# Patient Record
Sex: Male | Born: 1960 | Race: White | Hispanic: No | Marital: Single | Smoking: Never smoker
Health system: Southern US, Community
[De-identification: ages and names within clinical notes are randomized; demographics above are authoritative.]

## PROBLEM LIST (undated history)

## (undated) DIAGNOSIS — S0990XA Unspecified injury of head, initial encounter: Secondary | ICD-10-CM

## (undated) DIAGNOSIS — F329 Major depressive disorder, single episode, unspecified: Secondary | ICD-10-CM

## (undated) DIAGNOSIS — J309 Allergic rhinitis, unspecified: Secondary | ICD-10-CM

## (undated) DIAGNOSIS — F419 Anxiety disorder, unspecified: Secondary | ICD-10-CM

## (undated) DIAGNOSIS — G4730 Sleep apnea, unspecified: Secondary | ICD-10-CM

## (undated) DIAGNOSIS — M545 Low back pain, unspecified: Secondary | ICD-10-CM

## (undated) DIAGNOSIS — N189 Chronic kidney disease, unspecified: Secondary | ICD-10-CM

## (undated) DIAGNOSIS — F32A Depression, unspecified: Secondary | ICD-10-CM

## (undated) DIAGNOSIS — C61 Malignant neoplasm of prostate: Secondary | ICD-10-CM

## (undated) HISTORY — PX: VEIN LIGATION AND STRIPPING: SHX2653

## (undated) HISTORY — PX: HEMORRHOID SURGERY: SHX153

## (undated) HISTORY — PX: TONSILLECTOMY: SUR1361

## (undated) HISTORY — PX: JOINT REPLACEMENT: SHX530

## (undated) HISTORY — PX: PROSTATE BIOPSY: SHX241

---

## 1987-03-03 DIAGNOSIS — S0990XA Unspecified injury of head, initial encounter: Secondary | ICD-10-CM

## 1987-03-03 HISTORY — DX: Unspecified injury of head, initial encounter: S09.90XA

## 2006-05-12 ENCOUNTER — Emergency Department (HOSPITAL_COMMUNITY): Admission: EM | Admit: 2006-05-12 | Discharge: 2006-05-12 | Payer: Self-pay | Admitting: Emergency Medicine

## 2008-07-07 ENCOUNTER — Emergency Department (HOSPITAL_COMMUNITY): Admission: EM | Admit: 2008-07-07 | Discharge: 2008-07-07 | Payer: Self-pay | Admitting: Emergency Medicine

## 2011-09-16 ENCOUNTER — Other Ambulatory Visit: Payer: Self-pay | Admitting: Orthopedic Surgery

## 2011-09-16 DIAGNOSIS — M25512 Pain in left shoulder: Secondary | ICD-10-CM

## 2011-09-17 ENCOUNTER — Ambulatory Visit
Admission: RE | Admit: 2011-09-17 | Discharge: 2011-09-17 | Disposition: A | Discharge status: Home / Self Care | Payer: Medicare Other | Source: Ambulatory Visit | Attending: Orthopedic Surgery | Admitting: Orthopedic Surgery

## 2011-09-17 DIAGNOSIS — M25512 Pain in left shoulder: Secondary | ICD-10-CM

## 2011-09-24 ENCOUNTER — Other Ambulatory Visit: Payer: Self-pay | Admitting: Orthopedic Surgery

## 2011-09-28 ENCOUNTER — Encounter (HOSPITAL_COMMUNITY): Payer: Self-pay | Admitting: Pharmacy Technician

## 2011-10-01 ENCOUNTER — Encounter (HOSPITAL_COMMUNITY): Payer: Self-pay

## 2011-10-01 ENCOUNTER — Encounter (HOSPITAL_COMMUNITY)
Admission: RE | Admit: 2011-10-01 | Discharge: 2011-10-01 | Disposition: A | Discharge status: Home / Self Care | Payer: Medicare Other | Source: Ambulatory Visit | Attending: Orthopedic Surgery | Admitting: Orthopedic Surgery

## 2011-10-01 HISTORY — DX: Unspecified injury of head, initial encounter: S09.90XA

## 2011-10-01 HISTORY — DX: Sleep apnea, unspecified: G47.30

## 2011-10-01 HISTORY — DX: Chronic kidney disease, unspecified: N18.9

## 2011-10-01 LAB — CBC
HCT: 43 % (ref 39.0–52.0)
Hemoglobin: 14.9 g/dL (ref 13.0–17.0)
MCH: 30.7 pg (ref 26.0–34.0)
MCHC: 34.7 g/dL (ref 30.0–36.0)
MCV: 88.5 fL (ref 78.0–100.0)
RBC: 4.86 MIL/uL (ref 4.22–5.81)

## 2011-10-01 LAB — SURGICAL PCR SCREEN: MRSA, PCR: NEGATIVE

## 2011-10-01 NOTE — Patient Instructions (Signed)
20 Steve Mcmahon.  10/01/2011   Your procedure is scheduled on:  Wednesday 10/07/2011 at 1200 pm  Report to [facility] at 0930 AM.  Call this number if you have problems the morning of surgery: [phone]   Remember:   Do not eat food:After Midnight.  May have clear liquids:until Midnight .    Take these medicines the morning of surgery with A SIP OF WATER: Klonopin   Do not wear jewelry  Do not wear lotions, powders, or perfumes.   Do not shave 48 hours prior to surgery. Men may shave face and neck.  Do not bring valuables to the hospital.  Contacts, dentures or bridgework may not be worn into surgery.  Leave suitcase in the car. After surgery it may be brought to your room.  For patients admitted to the hospital, checkout time is 11:00 AM the day of discharge.       Special Instructions: CHG Shower Use Special Wash: 1/2 bottle night before surgery and 1/2 bottle morning of surgery.   Please read over the following fact sheets that you were given: MRSA Information, Sleep apnea sheet, Incentive Spirometry sheet                 If any questions ,please call me at [phone] [facility].Georgeanna Lea, RN,BSN

## 2011-10-07 ENCOUNTER — Encounter (HOSPITAL_COMMUNITY): Payer: Self-pay | Admitting: *Deleted

## 2011-10-07 ENCOUNTER — Encounter (HOSPITAL_COMMUNITY)
Admission: RE | Disposition: A | Discharge status: Home / Self Care | Payer: Self-pay | Source: Ambulatory Visit | Attending: Orthopedic Surgery

## 2011-10-07 ENCOUNTER — Ambulatory Visit (HOSPITAL_COMMUNITY): Payer: Medicare Other | Admitting: Anesthesiology

## 2011-10-07 ENCOUNTER — Encounter (HOSPITAL_COMMUNITY): Payer: Self-pay | Admitting: Anesthesiology

## 2011-10-07 ENCOUNTER — Observation Stay (HOSPITAL_COMMUNITY)
Admission: RE | Admit: 2011-10-07 | Discharge: 2011-10-08 | Disposition: A | Discharge status: Home / Self Care | Payer: Medicare Other | Source: Ambulatory Visit | Attending: Orthopedic Surgery | Admitting: Orthopedic Surgery

## 2011-10-07 DIAGNOSIS — Z01812 Encounter for preprocedural laboratory examination: Secondary | ICD-10-CM | POA: Insufficient documentation

## 2011-10-07 DIAGNOSIS — G4730 Sleep apnea, unspecified: Secondary | ICD-10-CM | POA: Insufficient documentation

## 2011-10-07 DIAGNOSIS — T8489XA Other specified complication of internal orthopedic prosthetic devices, implants and grafts, initial encounter: Principal | ICD-10-CM | POA: Insufficient documentation

## 2011-10-07 DIAGNOSIS — T85848A Pain due to other internal prosthetic devices, implants and grafts, initial encounter: Secondary | ICD-10-CM

## 2011-10-07 DIAGNOSIS — Y831 Surgical operation with implant of artificial internal device as the cause of abnormal reaction of the patient, or of later complication, without mention of misadventure at the time of the procedure: Secondary | ICD-10-CM | POA: Insufficient documentation

## 2011-10-07 DIAGNOSIS — Z79899 Other long term (current) drug therapy: Secondary | ICD-10-CM | POA: Insufficient documentation

## 2011-10-07 DIAGNOSIS — M25519 Pain in unspecified shoulder: Secondary | ICD-10-CM | POA: Insufficient documentation

## 2011-10-07 SURGERY — ARTHROSCOPY, SHOULDER, WITH ROTATOR CUFF REPAIR
Anesthesia: General | Site: Shoulder | Laterality: Left | Wound class: Clean

## 2011-10-07 MED ORDER — FENTANYL CITRATE 0.05 MG/ML IJ SOLN
50.0000 ug | INTRAMUSCULAR | Status: DC | PRN
  Administered 2011-10-07: 100 ug via INTRAVENOUS

## 2011-10-07 MED ORDER — METHOCARBAMOL 100 MG/ML IJ SOLN
500.0000 mg | Freq: Four times a day (QID) | INTRAVENOUS | Status: DC | PRN
  Filled 2011-10-07: qty 5

## 2011-10-07 MED ORDER — LACTATED RINGERS IR SOLN
Status: DC | PRN
  Administered 2011-10-07: 3000 mL

## 2011-10-07 MED ORDER — DEXTROSE IN LACTATED RINGERS 5 % IV SOLN: INTRAVENOUS | Status: DC

## 2011-10-07 MED ORDER — VANCOMYCIN HCL 1000 MG IV SOLR
1000.0000 mg | INTRAVENOUS | Status: DC | PRN
  Administered 2011-10-07: 1000 mg via INTRAVENOUS

## 2011-10-07 MED ORDER — GLYCOPYRROLATE 0.2 MG/ML IJ SOLN
INTRAMUSCULAR | Status: DC | PRN
  Administered 2011-10-07: 0.2 mg via INTRAVENOUS
  Administered 2011-10-07: .2 mg via INTRAVENOUS

## 2011-10-07 MED ORDER — MIDAZOLAM HCL 2 MG/2ML IJ SOLN
INTRAMUSCULAR | Status: AC
  Filled 2011-10-07: qty 2

## 2011-10-07 MED ORDER — ONDANSETRON HCL 4 MG/2ML IJ SOLN: 4.0000 mg | Freq: Four times a day (QID) | INTRAMUSCULAR | Status: DC | PRN

## 2011-10-07 MED ORDER — LIDOCAINE HCL (CARDIAC) 20 MG/ML IV SOLN
INTRAVENOUS | Status: DC | PRN
  Administered 2011-10-07: 100 mg via INTRAVENOUS

## 2011-10-07 MED ORDER — POVIDONE-IODINE 7.5 % EX SOLN: Freq: Once | CUTANEOUS | Status: DC

## 2011-10-07 MED ORDER — PROMETHAZINE HCL 25 MG/ML IJ SOLN: 6.2500 mg | INTRAMUSCULAR | Status: DC | PRN

## 2011-10-07 MED ORDER — ACETAMINOPHEN 10 MG/ML IV SOLN
INTRAVENOUS | Status: DC | PRN
  Administered 2011-10-07: 1000 mg via INTRAVENOUS

## 2011-10-07 MED ORDER — DEXTROSE IN LACTATED RINGERS 5 % IV SOLN
INTRAVENOUS | Status: DC
  Administered 2011-10-07: 19:00:00 via INTRAVENOUS

## 2011-10-07 MED ORDER — HYDROMORPHONE HCL PF 1 MG/ML IJ SOLN: 0.2500 mg | INTRAMUSCULAR | Status: DC | PRN

## 2011-10-07 MED ORDER — METHOCARBAMOL 500 MG PO TABS: 500.0000 mg | ORAL_TABLET | Freq: Four times a day (QID) | ORAL | Status: AC

## 2011-10-07 MED ORDER — PHENYLEPHRINE HCL 10 MG/ML IJ SOLN
INTRAMUSCULAR | Status: DC | PRN
  Administered 2011-10-07 (×2): 100 ug via INTRAVENOUS
  Administered 2011-10-07 (×2): 40 ug via INTRAVENOUS

## 2011-10-07 MED ORDER — EPINEPHRINE HCL 1 MG/ML IJ SOLN
INTRAMUSCULAR | Status: AC
  Filled 2011-10-07: qty 2

## 2011-10-07 MED ORDER — FENTANYL CITRATE 0.05 MG/ML IJ SOLN
INTRAMUSCULAR | Status: AC
  Filled 2011-10-07: qty 2

## 2011-10-07 MED ORDER — PROPOFOL 10 MG/ML IV EMUL
INTRAVENOUS | Status: DC | PRN
  Administered 2011-10-07: 150 mg via INTRAVENOUS

## 2011-10-07 MED ORDER — ROPIVACAINE HCL 5 MG/ML IJ SOLN
INTRAMUSCULAR | Status: AC
  Filled 2011-10-07: qty 30

## 2011-10-07 MED ORDER — ROCURONIUM BROMIDE 100 MG/10ML IV SOLN
INTRAVENOUS | Status: DC | PRN
  Administered 2011-10-07: 40 mg via INTRAVENOUS

## 2011-10-07 MED ORDER — BUPIVACAINE-EPINEPHRINE (PF) 0.5% -1:200000 IJ SOLN
INTRAMUSCULAR | Status: AC
  Filled 2011-10-07: qty 10

## 2011-10-07 MED ORDER — LACTATED RINGERS IV SOLN
INTRAVENOUS | Status: DC
  Administered 2011-10-07: 1000 mL via INTRAVENOUS
  Administered 2011-10-07: 12:00:00 via INTRAVENOUS

## 2011-10-07 MED ORDER — HYDROMORPHONE HCL PF 1 MG/ML IJ SOLN
0.5000 mg | INTRAMUSCULAR | Status: DC | PRN
  Administered 2011-10-08 (×3): 1 mg via INTRAVENOUS
  Filled 2011-10-07 (×3): qty 1

## 2011-10-07 MED ORDER — ROPIVACAINE HCL 5 MG/ML IJ SOLN
INTRAMUSCULAR | Status: DC | PRN
  Administered 2011-10-07: 30 mL

## 2011-10-07 MED ORDER — MEPERIDINE HCL 50 MG/ML IJ SOLN: 6.2500 mg | INTRAMUSCULAR | Status: DC | PRN

## 2011-10-07 MED ORDER — METOCLOPRAMIDE HCL 5 MG/ML IJ SOLN: 5.0000 mg | Freq: Three times a day (TID) | INTRAMUSCULAR | Status: DC | PRN

## 2011-10-07 MED ORDER — OXYCODONE-ACETAMINOPHEN 5-325 MG PO TABS
1.0000 | ORAL_TABLET | ORAL | Status: DC | PRN
  Administered 2011-10-07 – 2011-10-08 (×2): 1 via ORAL
  Administered 2011-10-08: 2 via ORAL
  Filled 2011-10-07 (×2): qty 1
  Filled 2011-10-07: qty 2

## 2011-10-07 MED ORDER — ONDANSETRON HCL 4 MG/2ML IJ SOLN
INTRAMUSCULAR | Status: DC | PRN
  Administered 2011-10-07: 4 mg via INTRAVENOUS

## 2011-10-07 MED ORDER — EPINEPHRINE HCL 1 MG/ML IJ SOLN
INTRAMUSCULAR | Status: DC | PRN
  Administered 2011-10-07: 1 mg

## 2011-10-07 MED ORDER — METHOCARBAMOL 500 MG PO TABS
500.0000 mg | ORAL_TABLET | Freq: Four times a day (QID) | ORAL | Status: DC | PRN
  Administered 2011-10-08: 500 mg via ORAL
  Filled 2011-10-07: qty 1

## 2011-10-07 MED ORDER — SUFENTANIL CITRATE 50 MCG/ML IV SOLN
INTRAVENOUS | Status: DC | PRN
  Administered 2011-10-07 (×2): 5 ug via INTRAVENOUS

## 2011-10-07 MED ORDER — LACTATED RINGERS IV SOLN: INTRAVENOUS | Status: DC

## 2011-10-07 MED ORDER — OXYCODONE-ACETAMINOPHEN 5-325 MG PO TABS: 1.0000 | ORAL_TABLET | ORAL | Status: AC | PRN

## 2011-10-07 MED ORDER — VANCOMYCIN HCL IN DEXTROSE 1-5 GM/200ML-% IV SOLN
INTRAVENOUS | Status: AC
  Filled 2011-10-07: qty 200

## 2011-10-07 MED ORDER — 0.9 % SODIUM CHLORIDE (POUR BTL) OPTIME
TOPICAL | Status: DC | PRN
  Administered 2011-10-07: 1000 mL

## 2011-10-07 MED ORDER — MIDAZOLAM HCL 2 MG/2ML IJ SOLN
1.0000 mg | INTRAMUSCULAR | Status: DC | PRN
  Administered 2011-10-07: 2 mg via INTRAVENOUS

## 2011-10-07 MED ORDER — ACETAMINOPHEN 10 MG/ML IV SOLN
INTRAVENOUS | Status: AC
  Filled 2011-10-07: qty 100

## 2011-10-07 MED ORDER — BUPIVACAINE-EPINEPHRINE 0.5% -1:200000 IJ SOLN
INTRAMUSCULAR | Status: DC | PRN
  Administered 2011-10-07: 9 mL

## 2011-10-07 SURGICAL SUPPLY — 49 items
BENZOIN TINCTURE PRP APPL 2/3 (GAUZE/BANDAGES/DRESSINGS) ×2 IMPLANT
BLADE 4.2CUDA (BLADE) ×2 IMPLANT
BLADE SURG SZ11 CARB STEEL (BLADE) ×2 IMPLANT
BOOTIES KNEE HIGH SLOAN (MISCELLANEOUS) ×4 IMPLANT
BUR OVAL 4.0 (BURR) ×2 IMPLANT
CLOTH BEACON ORANGE TIMEOUT ST (SAFETY) ×2 IMPLANT
DRAPE LG THREE QUARTER DISP (DRAPES) ×2 IMPLANT
DRAPE POUCH INSTRU U-SHP 10X18 (DRAPES) ×2 IMPLANT
DRAPE SHOULDER BEACH CHAIR (DRAPES) ×2 IMPLANT
DRAPE U-SHAPE 47X51 STRL (DRAPES) ×2 IMPLANT
DRSG ADAPTIC 3X8 NADH LF (GAUZE/BANDAGES/DRESSINGS) ×2 IMPLANT
DRSG PAD ABDOMINAL 8X10 ST (GAUZE/BANDAGES/DRESSINGS) ×2 IMPLANT
DURAPREP 26ML APPLICATOR (WOUND CARE) ×2 IMPLANT
ELECT KIT MENISCUS BASIC 165 (SET/KITS/TRAYS/PACK) IMPLANT
ELECT REM PT RETURN 9FT ADLT (ELECTROSURGICAL) ×2
ELECTRODE REM PT RTRN 9FT ADLT (ELECTROSURGICAL) ×1 IMPLANT
GLOVE BIOGEL PI IND STRL 8 (GLOVE) ×1 IMPLANT
GLOVE BIOGEL PI INDICATOR 8 (GLOVE) ×1
GLOVE ECLIPSE 8.0 STRL XLNG CF (GLOVE) ×2 IMPLANT
GLOVE INDICATOR 8.0 STRL GRN (GLOVE) ×4 IMPLANT
GOWN STRL REIN XL XLG (GOWN DISPOSABLE) ×4 IMPLANT
MANIFOLD NEPTUNE II (INSTRUMENTS) ×2 IMPLANT
NDL SAFETY ECLIPSE 18X1.5 (NEEDLE) ×1 IMPLANT
NEEDLE HYPO 18GX1.5 SHARP (NEEDLE) ×1
NEEDLE MA TROC 1/2 (NEEDLE) IMPLANT
NEEDLE SPNL 18GX3.5 QUINCKE PK (NEEDLE) ×2 IMPLANT
PACK SHOULDER CUSTOM OPM052 (CUSTOM PROCEDURE TRAY) ×2 IMPLANT
POSITIONER SURGICAL ARM (MISCELLANEOUS) ×2 IMPLANT
SET ARTHROSCOPY TUBING (MISCELLANEOUS) ×1
SET ARTHROSCOPY TUBING LN (MISCELLANEOUS) ×1 IMPLANT
SLING ARM IMMOBILIZER LRG (SOFTGOODS) ×2 IMPLANT
SLING ARM IMMOBILIZER MED (SOFTGOODS) IMPLANT
SPONGE GAUZE 4X4 12PLY (GAUZE/BANDAGES/DRESSINGS) ×2 IMPLANT
SPONGE LAP 18X18 X RAY DECT (DISPOSABLE) ×2 IMPLANT
SPONGE LAP 4X18 X RAY DECT (DISPOSABLE) ×2 IMPLANT
STAPLER VISISTAT (STAPLE) IMPLANT
STAPLER VISISTAT 35W (STAPLE) ×2 IMPLANT
STRIP CLOSURE SKIN 1/2X4 (GAUZE/BANDAGES/DRESSINGS) IMPLANT
SUCTION FRAZIER TIP 10 FR DISP (SUCTIONS) ×2 IMPLANT
SUT BONE WAX W31G (SUTURE) IMPLANT
SUT ETHIBOND NAB CT1 #1 30IN (SUTURE) ×2 IMPLANT
SUT ETHILON 4 0 PS 2 18 (SUTURE) IMPLANT
SUT VIC AB 1 CT1 27 (SUTURE) ×1
SUT VIC AB 1 CT1 27XBRD ANTBC (SUTURE) ×1 IMPLANT
SUT VIC AB 2-0 CT1 27 (SUTURE) ×1
SUT VIC AB 2-0 CT1 27XBRD (SUTURE) ×1 IMPLANT
SYR 3ML LL SCALE MARK (SYRINGE) ×2 IMPLANT
TUBING CONNECTING 10 (TUBING) ×2 IMPLANT
WAND 90 DEG TURBOVAC W/CORD (SURGICAL WAND) ×2 IMPLANT

## 2011-10-07 NOTE — Anesthesia Procedure Notes (Signed)
Anesthesia Regional Block:  Supraclavicular block  Pre-Anesthetic Checklist: ,, timeout performed, Correct Patient, Correct Site, Correct Laterality, Correct Procedure, Correct Position, site marked, Risks and benefits discussed,  Surgical consent,  Pre-op evaluation,  At surgeon's request and post-op pain management  Laterality: Left  Prep: chloraprep       Needles:  Injection technique: Single-shot  Needle Type: Stimiplex     Needle Length: 10cm 10 cm Needle Gauge: 21 G    Additional Needles:  Procedures: ultrasound guided and nerve stimulator Supraclavicular block Narrative:  Injection made incrementally with aspirations every 5 mL.  Performed by: Personally  Anesthesiologist: Phillips Grout MD  Additional Notes: Risks, benefits and alternative to block explained extensively.  Patient tolerated procedure well, without complications.  Supraclavicular block

## 2011-10-07 NOTE — Transfer of Care (Signed)
Immediate Anesthesia Transfer of Care Note  Patient: Steve Mcmahon.  Procedure(s) Performed: Procedure(s) (LRB): SHOULDER ARTHROSCOPY WITH ROTATOR CUFF REPAIR (Left) ARTHROTOMY (Left)  Patient Location: PACU  Anesthesia Type: General  Level of Consciousness: awake, alert , oriented and patient cooperative  Airway & Oxygen Therapy: Patient Spontanous Breathing and Patient connected to face mask oxygen  Post-op Assessment: Report given to PACU RN and Post -op Vital signs reviewed and stable  Post vital signs: Reviewed and stable  Complications: No apparent anesthesia complications
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
## 2011-10-07 NOTE — Brief Op Note (Signed)
10/07/2011  5:46 PM  PATIENT:  Steve Mcmahon.  51 y.o. male  PRE-OPERATIVE DIAGNOSIS:  loose screw left shoulder joint  POST-OPERATIVE DIAGNOSIS:  Fatigue fracture screw from Bristow shoulder reconstruction with painful fibrous union of bone block and painful loose screw and washer  PROCEDURE:  Procedure(s) (LRB): SHOULDER ARTHROSCOPY  With exploration and debridement of joint ARTHROTOMY (Left)with removal loosebone fragment,screw,and washer  SURGEON:  Surgeon(s) and Role:    * Drucilla Schmidt, MD - Primary  PHYSICIAN ASSISTANT: Alphonsa Overall PAC ASSISTANTS: nurse   ANESTHESIA:   regional  EBL:  Total I/O In: 2000 [I.V.:1800; IV Piggyback:200] Out: 400 [Urine:300; Blood:100]  BLOOD ADMINISTERED:none  DRAINS: none   LOCAL MEDICATIONS USED:  MARCAINE     SPECIMEN:  Source of Specimen:  srew and washer from left shoulder  DISPOSITION OF SPECIMEN:  N/A  COUNTS:  YES  TOURNIQUET:  * No tourniquets in log *  DICTATION: .Other Dictation: Dictation Number [id number]  PLAN OF CARE: Admit for overnight observation  PATIENT DISPOSITION:  PACU - hemodynamically stable.   Delay start of Pharmacological VTE agent (>24hrs) due to surgical blood loss or risk of bleeding: yes
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
## 2011-10-08 NOTE — Discharge Summary (Signed)
Physician Discharge Summary  Patient ID: Steve Mcmahon HM Lorrin Goodell. MRN: [id number] DOB/AGE: 1960-06-23 51 y.o.  Admit date: 10/07/2011 Discharge date: 10/08/2011  Admission Diagnoses:  Left shoulder painful hardware  Discharge Diagnoses:  Same   Surgeries: Procedure(s): SHOULDER ARTHROSCOPY WITH hardware removal ARTHROTOMY on 10/07/2011   Consultants: none  Discharged Condition: Stable  Hospital Course: Steve Mcmahon HM Shaheed Schmuck. is an 51 y.o. male who was admitted 10/07/2011 with a chief complaint of No chief complaint on file. , and found to have a diagnosis of <principal problem not specified>.  They were brought to the operating room on 10/07/2011 and underwent the above named procedures.    The patient had an uncomplicated hospital course and was stable for discharge.  Recent vital signs:  Filed Vitals:   10/08/11 0428  BP: 122/69  Pulse: 86  Temp: 98 F (36.7 C)  Resp: 14    Recent laboratory studies:  Results for orders placed during the hospital encounter of 10/01/11  SURGICAL PCR SCREEN      Component Value Range   MRSA, PCR NEGATIVE  NEGATIVE   Staphylococcus aureus NEGATIVE  NEGATIVE  CBC      Component Value Range   WBC 5.7  4.0 - 10.5 K/uL   RBC 4.86  4.22 - 5.81 MIL/uL   Hemoglobin 14.9  13.0 - 17.0 g/dL   HCT 40.9  81.1 - 91.4 %   MCV 88.5  78.0 - 100.0 fL   MCH 30.7  26.0 - 34.0 pg   MCHC 34.7  30.0 - 36.0 g/dL   RDW 78.2  95.6 - 21.3 %   Platelets 269  150 - 400 K/uL    Discharge Medications:   Medication List  As of 10/08/2011 11:05 AM   TAKE these medications         CALCIUM 600 + D PO   Take 1 tablet by mouth 2 (two) times daily.      clonazePAM 0.5 MG tablet   Commonly known as: KLONOPIN   Take 0.5 mg by mouth See admin instructions. Patient takes 1/2  Tablet  In am and  a tablet at bedtime      glucosamine-chondroitin 500-400 MG tablet   Take 1 tablet by mouth 2 (two) times daily.      L-Glutamine 500 MG Tabs   Take 1 tablet by mouth daily.        methocarbamol 500 MG tablet   Commonly known as: ROBAXIN   Take 1 tablet (500 mg total) by mouth 4 (four) times daily.      multivitamin with minerals Tabs   Take 1 tablet by mouth daily.      OVER THE COUNTER MEDICATION   Take 1 capsule by mouth as needed. For gas/bloating      oxyCODONE-acetaminophen 5-325 MG per tablet   Commonly known as: PERCOCET/ROXICET   Take 1 tablet by mouth every 4 (four) hours as needed for pain.      vitamin B-12 500 MCG tablet   Commonly known as: CYANOCOBALAMIN   Take 500 mcg by mouth daily.      vitamin C 1000 MG tablet   Take 1,000 mg by mouth daily.            Diagnostic Studies: Ct Shoulder Left Wo Contrast  09/17/2011  *RADIOLOGY REPORT*  Clinical Data: Increasing left shoulder pain for several months. History of rotator cuff surgery 25 years ago.  Evaluate fractured hardware.  CT OF THE LEFT SHOULDER WITHOUT CONTRAST  Technique:  Multidetector CT imaging was performed according to the standard protocol. Multiplanar CT image reconstructions were also generated.  Comparison: None.  Findings: There appears to be a single fractured screw within the glenoid.  The head of the screw and a washer are anteriorly positioned within a 1.5 cm ossicle which appears separate from the glenoid. The deep portion of the screw is imbedded within the superior aspect of the glenoid. There is no osseous lucency surrounding either component.  There are advanced glenohumeral degenerative changes with chondral thinning, subchondral cyst formation and osteophytes.  No discrete loose bodies are seen. There is no evidence of acute fracture or humeral head avascular necrosis.  The acromion is type 1.  There are no significant acromioclavicular degenerative changes.  The subacromial space is preserved.  No focal rotator cuff muscular atrophy is demonstrated.  IMPRESSION:  1.  Fractured glenoid screw.  The head of the screw is positioned within an ossicle anterior to the glenoid,  possibly from a bone block procedure for anterior stabilization.  Correlation with prior surgical history recommended. 2.  Advanced glenohumeral degenerative changes.  No discrete loose body identified. 3.  No acute osseous findings.  Original Report Authenticated By: Gerrianne Scale, M.D.    Disposition: 01-Home or Self Care  Discharge Orders    Future Orders Please Complete By Expires   Diet - low sodium heart healthy      Diet - low sodium heart healthy      Call MD / Call 911      Comments:   If you experience chest pain or shortness of breath, CALL 911 and be transported to the hospital emergency room.  If you develope a fever above 101 F, pus (white drainage) or increased drainage or redness at the wound, or calf pain, call your surgeon's office.   Constipation Prevention      Comments:   Drink plenty of fluids.  Prune juice may be helpful.  You may use a stool softener, such as Colace (over the counter) 100 mg twice a day.  Use MiraLax (over the counter) for constipation as needed.   Increase activity slowly as tolerated      Call MD / Call 911      Comments:   If you experience chest pain or shortness of breath, CALL 911 and be transported to the hospital emergency room.  If you develope a fever above 101 F, pus (white drainage) or increased drainage or redness at the wound, or calf pain, call your surgeon's office.   Constipation Prevention      Comments:   Drink plenty of fluids.  Prune juice may be helpful.  You may use a stool softener, such as Colace (over the counter) 100 mg twice a day.  Use MiraLax (over the counter) for constipation as needed.   Increase activity slowly as tolerated      Discharge instructions      Comments:   Keep dressing dry and arm in sling.  May move elboe.  Office 8/12.  Call [phone] for appt.         Signed: Hikeem Andersson B 10/08/2011, 11:05 AM

## 2011-10-08 NOTE — Progress Notes (Signed)
Utilization review completed.  

## 2011-10-09 NOTE — Anesthesia Postprocedure Evaluation (Signed)
  Anesthesia Post-op Note  Patient: Steve Mcmahon.  Procedure(s) Performed: Procedure(s) (LRB): SHOULDER ARTHROSCOPY WITH ROTATOR CUFF REPAIR (Left) ARTHROTOMY (Left)  Patient Location: PACU  Anesthesia Type: GA combined with regional for post-op pain  Level of Consciousness: awake and alert   Airway and Oxygen Therapy: Patient Spontanous Breathing  Post-op Pain: mild  Post-op Assessment: Post-op Vital signs reviewed, Patient's Cardiovascular Status Stable, Respiratory Function Stable, Patent Airway and No signs of Nausea or vomiting  Post-op Vital Signs: stable  Complications: No apparent anesthesia complications

## 2011-10-16 NOTE — Op Note (Signed)
NAME:  ZEBEDIAH, BEEZLEY NO.:  [id number]  MEDICAL RECORD NO.:  [id number]  LOCATION:  1602                         FACILITY:  [facility]  PHYSICIAN:  Marlowe Kays, M.D.  DATE OF BIRTH:  [birth year]  DATE OF PROCEDURE:  10/07/2011 DATE OF DISCHARGE:  10/08/2011                              OPERATIVE REPORT   ASSISTANT:  Maisie Fus B. Durwin Nora, PA-C          ______________________________ Marlowe Kays, M.D.     JA/MEDQ  D:  10/15/2011  T:  10/16/2011  Job:  [id number]

## 2014-07-12 ENCOUNTER — Other Ambulatory Visit: Payer: Self-pay | Admitting: Orthopedic Surgery

## 2014-07-12 DIAGNOSIS — M25512 Pain in left shoulder: Secondary | ICD-10-CM

## 2014-07-16 ENCOUNTER — Ambulatory Visit
Admission: RE | Admit: 2014-07-16 | Discharge: 2014-07-16 | Disposition: A | Discharge status: Home / Self Care | Payer: Medicare Other | Source: Ambulatory Visit | Attending: Orthopedic Surgery | Admitting: Orthopedic Surgery

## 2014-07-16 DIAGNOSIS — M25512 Pain in left shoulder: Secondary | ICD-10-CM

## 2014-07-19 ENCOUNTER — Other Ambulatory Visit: Payer: Self-pay | Admitting: Orthopedic Surgery

## 2014-07-19 DIAGNOSIS — M25511 Pain in right shoulder: Secondary | ICD-10-CM

## 2014-08-14 ENCOUNTER — Other Ambulatory Visit: Payer: Medicare Other

## 2016-11-13 ENCOUNTER — Encounter: Payer: Self-pay | Admitting: Radiation Oncology
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
## 2016-11-16 ENCOUNTER — Ambulatory Visit
Admission: RE | Admit: 2016-11-16 | Discharge: 2016-11-16 | Disposition: A | Discharge status: Home / Self Care | Payer: Non-veteran care | Source: Ambulatory Visit | Attending: Radiation Oncology | Admitting: Radiation Oncology

## 2016-11-16 ENCOUNTER — Encounter: Payer: Self-pay | Admitting: Radiation Oncology

## 2016-11-16 DIAGNOSIS — C61 Malignant neoplasm of prostate: Secondary | ICD-10-CM | POA: Diagnosis not present

## 2016-11-16 DIAGNOSIS — Z510 Encounter for antineoplastic radiation therapy: Secondary | ICD-10-CM | POA: Insufficient documentation

## 2016-11-16 DIAGNOSIS — Z87820 Personal history of traumatic brain injury: Secondary | ICD-10-CM | POA: Diagnosis not present

## 2016-11-16 DIAGNOSIS — F329 Major depressive disorder, single episode, unspecified: Secondary | ICD-10-CM | POA: Diagnosis not present

## 2016-11-16 DIAGNOSIS — Z87442 Personal history of urinary calculi: Secondary | ICD-10-CM | POA: Insufficient documentation

## 2016-11-16 DIAGNOSIS — R413 Other amnesia: Secondary | ICD-10-CM | POA: Diagnosis not present

## 2016-11-16 DIAGNOSIS — Z79899 Other long term (current) drug therapy: Secondary | ICD-10-CM | POA: Insufficient documentation

## 2016-11-16 DIAGNOSIS — Z880 Allergy status to penicillin: Secondary | ICD-10-CM | POA: Diagnosis not present

## 2016-11-16 DIAGNOSIS — F419 Anxiety disorder, unspecified: Secondary | ICD-10-CM | POA: Diagnosis not present

## 2016-11-16 DIAGNOSIS — Z9889 Other specified postprocedural states: Secondary | ICD-10-CM | POA: Insufficient documentation

## 2016-11-16 DIAGNOSIS — R51 Headache: Secondary | ICD-10-CM | POA: Diagnosis not present

## 2016-11-16 DIAGNOSIS — G4730 Sleep apnea, unspecified: Secondary | ICD-10-CM | POA: Insufficient documentation

## 2016-11-16 DIAGNOSIS — N189 Chronic kidney disease, unspecified: Secondary | ICD-10-CM | POA: Diagnosis not present

## 2016-11-16 HISTORY — DX: Low back pain, unspecified: M54.50

## 2016-11-16 HISTORY — DX: Major depressive disorder, single episode, unspecified: F32.9

## 2016-11-16 HISTORY — DX: Allergic rhinitis, unspecified: J30.9

## 2016-11-16 HISTORY — DX: Anxiety disorder, unspecified: F41.9

## 2016-11-16 HISTORY — DX: Malignant neoplasm of prostate: C61

## 2016-11-16 HISTORY — DX: Depression, unspecified: F32.A

## 2016-11-16 HISTORY — DX: Low back pain: M54.5
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
## 2016-11-16 NOTE — Progress Notes (Signed)
See progress note under physician encounter. 

## 2016-11-18 ENCOUNTER — Ambulatory Visit
Admission: RE | Admit: 2016-11-18 | Discharge: 2016-11-18 | Disposition: A | Discharge status: Home / Self Care | Payer: Non-veteran care | Source: Ambulatory Visit | Attending: Radiation Oncology | Admitting: Radiation Oncology

## 2016-11-18 DIAGNOSIS — C61 Malignant neoplasm of prostate: Secondary | ICD-10-CM

## 2016-11-18 DIAGNOSIS — Z510 Encounter for antineoplastic radiation therapy: Secondary | ICD-10-CM | POA: Diagnosis not present

## 2016-11-18 NOTE — Progress Notes (Signed)
  Radiation Oncology         [phone]) [phone] ________________________________  Name: Steve Mcmahon. MRN: [id number]  Date: 11/18/2016  DOB: 11-08-1960  SIMULATION AND TREATMENT PLANNING NOTE    ICD-10-CM   1. Neoplasm of prostate, malignant ([place]) C61     DIAGNOSIS:  56 y.o. gentleman with Stage T1c adenocarcinoma of the prostate with Gleason Score of 3+4, and PSA of 5.63  NARRATIVE:  The patient was brought to the [facility].  Identity was confirmed.  All relevant records and images related to the planned course of therapy were reviewed.  The patient freely provided informed written consent to proceed with treatment after reviewing the details related to the planned course of therapy. The consent form was witnessed and verified by the simulation staff.  Then, the patient was set-up in a stable reproducible supine position for radiation therapy.  A vacuum lock pillow device was custom fabricated to position his legs in a reproducible immobilized position.  Then, I performed a urethrogram under sterile conditions to identify the prostatic apex.  CT images were obtained.  Surface markings were placed.  The CT images were loaded into the planning software.  Then the prostate target and avoidance structures including the rectum, bladder, bowel and hips were contoured.  Treatment planning then occurred.  The radiation prescription was entered and confirmed.  A total of one complex treatment devices was fabricated. I have requested : Intensity Modulated Radiotherapy (IMRT) is medically necessary for this case for the following reason:  Rectal sparing.Marland Kitchen  PLAN:  The patient will receive 78 Gy in 40 fractions.  ________________________________  Sheral Apley Tammi Klippel, M.D.   This document serves as a record of services personally performed by Tyler Pita, MD. It was created on his behalf by Arlyce Harman, a trained medical scribe. The creation of this record is based on the  scribe's personal observations and the provider's statements to them. This document has been checked and approved by the attending provider.

## 2016-11-20 ENCOUNTER — Telehealth: Payer: Self-pay | Admitting: Medical Oncology

## 2016-11-20 NOTE — Telephone Encounter (Signed)
Left a message to introduce myself as the prostate nurse navigator. I was unable to meet him the day he consulted with Dr. Tammi Klippel. He will begin radiation treatments 11/30/16 and look forward to meeting him.I asked him to call me with questions or concerns.

## 2016-11-23 ENCOUNTER — Telehealth: Payer: Self-pay | Admitting: Radiation Oncology

## 2016-11-23 NOTE — Telephone Encounter (Signed)
Phoned patient as requested by Freeman Caldron, PA-C. No answer. Left message explaining if he would like to go to [place] he will need to get the referral from Dr. Reece Agar at the [facility]. Went onto add that this route ensure his VA benefits are utilized.

## 2016-11-23 NOTE — Telephone Encounter (Signed)
-----   Message from Freeman Caldron, [place] sent at 11/18/2016  4:50 PM EDT ----- Regarding: RE: Requesting referral Contact: [phone] [name], I talked to him at length about proton therapy today prior to his CT Simulation so this may now be a moot point, but if he is still interested, please let him know that he would need the referral from his urologist,  Dr. Reece Agar at the [facility].  Thanks! -Ashlyn  ----- Message ----- From: Heywood Footman, RN Sent: 11/18/2016   3:45 PM To: Freeman Caldron, PA-C, # Subject: Requesting referral                            Ashlyn/Alison.  Steve Mcmahon was consulted on Monday. He called back this morning and left a message requesting a referral to [place] [facility]. He expressed frustration about being told there were only three proton therapy places in [place] "cause he has found 30."   [name]

## 2016-11-24 ENCOUNTER — Telehealth: Payer: Self-pay | Admitting: *Deleted

## 2016-11-24 NOTE — Telephone Encounter (Signed)
[facility]

## 2016-11-26 ENCOUNTER — Telehealth: Payer: Self-pay | Admitting: Medical Oncology

## 2016-11-26 DIAGNOSIS — Z510 Encounter for antineoplastic radiation therapy: Secondary | ICD-10-CM | POA: Diagnosis not present

## 2016-11-26 NOTE — Telephone Encounter (Signed)
Mr. Steve Mcmahon had called and had questions regarding proton therapy vs IMRT. He informed me that he went to the [place] in [place] today for a second opinion. He saw Dr. Truman Hayward and he agreed with Dr. Johny Shears recommendations and spoke very highly of Dr. Tammi Klippel. All questions were answered about proton vs IMRT and he voiced understanding. He states that he is scared and just wants to make the right decision. He states he will be here October 1 to start treatment.

## 2016-11-30 ENCOUNTER — Ambulatory Visit
Admission: RE | Admit: 2016-11-30 | Discharge: 2016-11-30 | Disposition: A | Discharge status: Home / Self Care | Payer: Non-veteran care | Source: Ambulatory Visit | Attending: Radiation Oncology | Admitting: Radiation Oncology

## 2016-11-30 ENCOUNTER — Encounter: Payer: Self-pay | Admitting: Medical Oncology

## 2016-11-30 DIAGNOSIS — Z510 Encounter for antineoplastic radiation therapy: Secondary | ICD-10-CM | POA: Diagnosis not present

## 2016-12-01 ENCOUNTER — Ambulatory Visit
Admission: RE | Admit: 2016-12-01 | Discharge: 2016-12-01 | Disposition: A | Discharge status: Home / Self Care | Payer: Non-veteran care | Source: Ambulatory Visit | Attending: Radiation Oncology | Admitting: Radiation Oncology

## 2016-12-01 DIAGNOSIS — Z510 Encounter for antineoplastic radiation therapy: Secondary | ICD-10-CM | POA: Diagnosis not present

## 2016-12-02 ENCOUNTER — Ambulatory Visit
Admission: RE | Admit: 2016-12-02 | Discharge: 2016-12-02 | Disposition: A | Discharge status: Home / Self Care | Payer: Non-veteran care | Source: Ambulatory Visit | Attending: Radiation Oncology | Admitting: Radiation Oncology

## 2016-12-02 DIAGNOSIS — Z510 Encounter for antineoplastic radiation therapy: Secondary | ICD-10-CM | POA: Diagnosis not present

## 2016-12-03 ENCOUNTER — Ambulatory Visit
Admission: RE | Admit: 2016-12-03 | Discharge: 2016-12-03 | Disposition: A | Discharge status: Home / Self Care | Payer: Non-veteran care | Source: Ambulatory Visit | Attending: Radiation Oncology | Admitting: Radiation Oncology

## 2016-12-03 DIAGNOSIS — Z510 Encounter for antineoplastic radiation therapy: Secondary | ICD-10-CM | POA: Diagnosis not present

## 2016-12-04 ENCOUNTER — Ambulatory Visit
Admission: RE | Admit: 2016-12-04 | Discharge: 2016-12-04 | Disposition: A | Discharge status: Home / Self Care | Payer: Non-veteran care | Source: Ambulatory Visit | Attending: Radiation Oncology | Admitting: Radiation Oncology

## 2016-12-04 DIAGNOSIS — Z510 Encounter for antineoplastic radiation therapy: Secondary | ICD-10-CM | POA: Diagnosis not present

## 2016-12-07 ENCOUNTER — Ambulatory Visit
Admission: RE | Admit: 2016-12-07 | Discharge: 2016-12-07 | Disposition: A | Discharge status: Home / Self Care | Payer: Non-veteran care | Source: Ambulatory Visit | Attending: Radiation Oncology | Admitting: Radiation Oncology

## 2016-12-07 DIAGNOSIS — Z510 Encounter for antineoplastic radiation therapy: Secondary | ICD-10-CM | POA: Diagnosis not present

## 2016-12-08 ENCOUNTER — Ambulatory Visit
Admission: RE | Admit: 2016-12-08 | Discharge: 2016-12-08 | Disposition: A | Discharge status: Home / Self Care | Payer: Non-veteran care | Source: Ambulatory Visit | Attending: Radiation Oncology | Admitting: Radiation Oncology

## 2016-12-08 DIAGNOSIS — Z510 Encounter for antineoplastic radiation therapy: Secondary | ICD-10-CM | POA: Diagnosis not present

## 2016-12-09 ENCOUNTER — Ambulatory Visit
Admission: RE | Admit: 2016-12-09 | Discharge: 2016-12-09 | Disposition: A | Discharge status: Home / Self Care | Payer: Non-veteran care | Source: Ambulatory Visit | Attending: Radiation Oncology | Admitting: Radiation Oncology

## 2016-12-09 DIAGNOSIS — Z510 Encounter for antineoplastic radiation therapy: Secondary | ICD-10-CM | POA: Diagnosis not present

## 2016-12-10 ENCOUNTER — Ambulatory Visit
Admission: RE | Admit: 2016-12-10 | Discharge: 2016-12-10 | Disposition: A | Discharge status: Home / Self Care | Payer: Non-veteran care | Source: Ambulatory Visit | Attending: Radiation Oncology | Admitting: Radiation Oncology

## 2016-12-10 DIAGNOSIS — Z510 Encounter for antineoplastic radiation therapy: Secondary | ICD-10-CM | POA: Diagnosis not present

## 2016-12-11 ENCOUNTER — Ambulatory Visit
Admission: RE | Admit: 2016-12-11 | Discharge: 2016-12-11 | Disposition: A | Discharge status: Home / Self Care | Payer: Non-veteran care | Source: Ambulatory Visit | Attending: Radiation Oncology | Admitting: Radiation Oncology

## 2016-12-11 DIAGNOSIS — Z510 Encounter for antineoplastic radiation therapy: Secondary | ICD-10-CM | POA: Diagnosis not present

## 2016-12-14 ENCOUNTER — Ambulatory Visit
Admission: RE | Admit: 2016-12-14 | Discharge: 2016-12-14 | Disposition: A | Discharge status: Home / Self Care | Payer: Non-veteran care | Source: Ambulatory Visit | Attending: Radiation Oncology | Admitting: Radiation Oncology

## 2016-12-14 DIAGNOSIS — Z510 Encounter for antineoplastic radiation therapy: Secondary | ICD-10-CM | POA: Diagnosis not present

## 2016-12-15 ENCOUNTER — Ambulatory Visit
Admission: RE | Admit: 2016-12-15 | Discharge: 2016-12-15 | Disposition: A | Discharge status: Home / Self Care | Payer: Non-veteran care | Source: Ambulatory Visit | Attending: Radiation Oncology | Admitting: Radiation Oncology

## 2016-12-15 DIAGNOSIS — Z510 Encounter for antineoplastic radiation therapy: Secondary | ICD-10-CM | POA: Diagnosis not present

## 2016-12-16 ENCOUNTER — Ambulatory Visit
Admission: RE | Admit: 2016-12-16 | Discharge: 2016-12-16 | Disposition: A | Discharge status: Home / Self Care | Payer: Non-veteran care | Source: Ambulatory Visit | Attending: Radiation Oncology | Admitting: Radiation Oncology

## 2016-12-16 DIAGNOSIS — Z510 Encounter for antineoplastic radiation therapy: Secondary | ICD-10-CM | POA: Diagnosis not present

## 2016-12-17 ENCOUNTER — Ambulatory Visit
Admission: RE | Admit: 2016-12-17 | Discharge: 2016-12-17 | Disposition: A | Discharge status: Home / Self Care | Payer: Non-veteran care | Source: Ambulatory Visit | Attending: Radiation Oncology | Admitting: Radiation Oncology

## 2016-12-17 DIAGNOSIS — Z510 Encounter for antineoplastic radiation therapy: Secondary | ICD-10-CM | POA: Diagnosis not present

## 2016-12-18 ENCOUNTER — Ambulatory Visit
Admission: RE | Admit: 2016-12-18 | Discharge: 2016-12-18 | Disposition: A | Discharge status: Home / Self Care | Payer: Non-veteran care | Source: Ambulatory Visit | Attending: Radiation Oncology | Admitting: Radiation Oncology

## 2016-12-18 DIAGNOSIS — Z510 Encounter for antineoplastic radiation therapy: Secondary | ICD-10-CM | POA: Diagnosis not present

## 2016-12-21 ENCOUNTER — Ambulatory Visit
Admission: RE | Admit: 2016-12-21 | Discharge: 2016-12-21 | Disposition: A | Discharge status: Home / Self Care | Payer: Non-veteran care | Source: Ambulatory Visit | Attending: Radiation Oncology | Admitting: Radiation Oncology

## 2016-12-21 DIAGNOSIS — Z510 Encounter for antineoplastic radiation therapy: Secondary | ICD-10-CM | POA: Diagnosis not present

## 2016-12-22 ENCOUNTER — Ambulatory Visit
Admission: RE | Admit: 2016-12-22 | Discharge: 2016-12-22 | Disposition: A | Discharge status: Home / Self Care | Payer: Non-veteran care | Source: Ambulatory Visit | Attending: Radiation Oncology | Admitting: Radiation Oncology

## 2016-12-22 DIAGNOSIS — Z510 Encounter for antineoplastic radiation therapy: Secondary | ICD-10-CM | POA: Diagnosis not present

## 2016-12-23 ENCOUNTER — Ambulatory Visit
Admission: RE | Admit: 2016-12-23 | Discharge: 2016-12-23 | Disposition: A | Discharge status: Home / Self Care | Payer: Non-veteran care | Source: Ambulatory Visit | Attending: Radiation Oncology | Admitting: Radiation Oncology

## 2016-12-23 DIAGNOSIS — Z510 Encounter for antineoplastic radiation therapy: Secondary | ICD-10-CM | POA: Diagnosis not present

## 2016-12-24 ENCOUNTER — Ambulatory Visit
Admission: RE | Admit: 2016-12-24 | Discharge: 2016-12-24 | Disposition: A | Discharge status: Home / Self Care | Payer: Non-veteran care | Source: Ambulatory Visit | Attending: Radiation Oncology | Admitting: Radiation Oncology

## 2016-12-24 DIAGNOSIS — Z510 Encounter for antineoplastic radiation therapy: Secondary | ICD-10-CM | POA: Diagnosis not present

## 2016-12-25 ENCOUNTER — Ambulatory Visit
Admission: RE | Admit: 2016-12-25 | Discharge: 2016-12-25 | Disposition: A | Discharge status: Home / Self Care | Payer: Non-veteran care | Source: Ambulatory Visit | Attending: Radiation Oncology | Admitting: Radiation Oncology

## 2016-12-25 DIAGNOSIS — Z510 Encounter for antineoplastic radiation therapy: Secondary | ICD-10-CM | POA: Diagnosis not present

## 2016-12-28 ENCOUNTER — Ambulatory Visit
Admission: RE | Admit: 2016-12-28 | Discharge: 2016-12-28 | Disposition: A | Discharge status: Home / Self Care | Payer: Non-veteran care | Source: Ambulatory Visit | Attending: Radiation Oncology | Admitting: Radiation Oncology

## 2016-12-28 DIAGNOSIS — Z510 Encounter for antineoplastic radiation therapy: Secondary | ICD-10-CM | POA: Diagnosis not present

## 2016-12-29 ENCOUNTER — Ambulatory Visit
Admission: RE | Admit: 2016-12-29 | Discharge: 2016-12-29 | Disposition: A | Discharge status: Home / Self Care | Payer: Non-veteran care | Source: Ambulatory Visit | Attending: Radiation Oncology | Admitting: Radiation Oncology

## 2016-12-29 DIAGNOSIS — Z510 Encounter for antineoplastic radiation therapy: Secondary | ICD-10-CM | POA: Diagnosis not present

## 2016-12-30 ENCOUNTER — Ambulatory Visit
Admission: RE | Admit: 2016-12-30 | Discharge: 2016-12-30 | Disposition: A | Discharge status: Home / Self Care | Payer: Non-veteran care | Source: Ambulatory Visit | Attending: Radiation Oncology | Admitting: Radiation Oncology

## 2016-12-30 DIAGNOSIS — Z510 Encounter for antineoplastic radiation therapy: Secondary | ICD-10-CM | POA: Diagnosis not present

## 2016-12-31 ENCOUNTER — Ambulatory Visit
Admission: RE | Admit: 2016-12-31 | Discharge: 2016-12-31 | Disposition: A | Discharge status: Home / Self Care | Payer: Non-veteran care | Source: Ambulatory Visit | Attending: Radiation Oncology | Admitting: Radiation Oncology

## 2016-12-31 DIAGNOSIS — Z510 Encounter for antineoplastic radiation therapy: Secondary | ICD-10-CM | POA: Diagnosis not present

## 2017-01-01 ENCOUNTER — Ambulatory Visit
Admission: RE | Admit: 2017-01-01 | Discharge: 2017-01-01 | Disposition: A | Discharge status: Home / Self Care | Payer: Non-veteran care | Source: Ambulatory Visit | Attending: Radiation Oncology | Admitting: Radiation Oncology

## 2017-01-01 DIAGNOSIS — Z510 Encounter for antineoplastic radiation therapy: Secondary | ICD-10-CM | POA: Diagnosis not present

## 2017-01-04 ENCOUNTER — Ambulatory Visit
Admission: RE | Admit: 2017-01-04 | Discharge: 2017-01-04 | Disposition: A | Discharge status: Home / Self Care | Payer: Non-veteran care | Source: Ambulatory Visit | Attending: Radiation Oncology | Admitting: Radiation Oncology

## 2017-01-04 DIAGNOSIS — Z510 Encounter for antineoplastic radiation therapy: Secondary | ICD-10-CM | POA: Diagnosis not present

## 2017-01-05 ENCOUNTER — Ambulatory Visit
Admission: RE | Admit: 2017-01-05 | Discharge: 2017-01-05 | Disposition: A | Discharge status: Home / Self Care | Payer: Non-veteran care | Source: Ambulatory Visit | Attending: Radiation Oncology | Admitting: Radiation Oncology

## 2017-01-05 DIAGNOSIS — Z510 Encounter for antineoplastic radiation therapy: Secondary | ICD-10-CM | POA: Diagnosis not present

## 2017-01-06 ENCOUNTER — Ambulatory Visit
Admission: RE | Admit: 2017-01-06 | Discharge: 2017-01-06 | Disposition: A | Discharge status: Home / Self Care | Payer: Non-veteran care | Source: Ambulatory Visit | Attending: Radiation Oncology | Admitting: Radiation Oncology

## 2017-01-06 DIAGNOSIS — Z510 Encounter for antineoplastic radiation therapy: Secondary | ICD-10-CM | POA: Diagnosis not present

## 2017-01-07 ENCOUNTER — Ambulatory Visit
Admission: RE | Admit: 2017-01-07 | Discharge: 2017-01-07 | Disposition: A | Discharge status: Home / Self Care | Payer: Non-veteran care | Source: Ambulatory Visit | Attending: Radiation Oncology | Admitting: Radiation Oncology

## 2017-01-07 DIAGNOSIS — Z510 Encounter for antineoplastic radiation therapy: Secondary | ICD-10-CM | POA: Diagnosis not present

## 2017-01-08 ENCOUNTER — Ambulatory Visit
Admission: RE | Admit: 2017-01-08 | Discharge: 2017-01-08 | Disposition: A | Discharge status: Home / Self Care | Payer: Non-veteran care | Source: Ambulatory Visit | Attending: Radiation Oncology | Admitting: Radiation Oncology

## 2017-01-08 DIAGNOSIS — Z510 Encounter for antineoplastic radiation therapy: Secondary | ICD-10-CM | POA: Diagnosis not present

## 2017-01-11 ENCOUNTER — Ambulatory Visit
Admission: RE | Admit: 2017-01-11 | Discharge: 2017-01-11 | Disposition: A | Discharge status: Home / Self Care | Payer: Non-veteran care | Source: Ambulatory Visit | Attending: Radiation Oncology | Admitting: Radiation Oncology

## 2017-01-11 DIAGNOSIS — Z510 Encounter for antineoplastic radiation therapy: Secondary | ICD-10-CM | POA: Diagnosis not present

## 2017-01-12 ENCOUNTER — Ambulatory Visit
Admission: RE | Admit: 2017-01-12 | Discharge: 2017-01-12 | Disposition: A | Discharge status: Home / Self Care | Payer: Non-veteran care | Source: Ambulatory Visit | Attending: Radiation Oncology | Admitting: Radiation Oncology

## 2017-01-12 DIAGNOSIS — Z510 Encounter for antineoplastic radiation therapy: Secondary | ICD-10-CM | POA: Diagnosis not present

## 2017-01-13 ENCOUNTER — Ambulatory Visit
Admission: RE | Admit: 2017-01-13 | Discharge: 2017-01-13 | Disposition: A | Discharge status: Home / Self Care | Payer: Non-veteran care | Source: Ambulatory Visit | Attending: Radiation Oncology | Admitting: Radiation Oncology

## 2017-01-13 DIAGNOSIS — Z510 Encounter for antineoplastic radiation therapy: Secondary | ICD-10-CM | POA: Diagnosis not present

## 2017-01-14 ENCOUNTER — Ambulatory Visit
Admission: RE | Admit: 2017-01-14 | Discharge: 2017-01-14 | Disposition: A | Discharge status: Home / Self Care | Payer: Non-veteran care | Source: Ambulatory Visit | Attending: Radiation Oncology | Admitting: Radiation Oncology

## 2017-01-14 DIAGNOSIS — Z510 Encounter for antineoplastic radiation therapy: Secondary | ICD-10-CM | POA: Diagnosis not present

## 2017-01-15 ENCOUNTER — Ambulatory Visit
Admission: RE | Admit: 2017-01-15 | Discharge: 2017-01-15 | Disposition: A | Discharge status: Home / Self Care | Payer: Non-veteran care | Source: Ambulatory Visit | Attending: Radiation Oncology | Admitting: Radiation Oncology

## 2017-01-15 ENCOUNTER — Other Ambulatory Visit: Payer: Self-pay | Admitting: Medical Oncology

## 2017-01-15 DIAGNOSIS — Z510 Encounter for antineoplastic radiation therapy: Secondary | ICD-10-CM | POA: Diagnosis not present

## 2017-01-17 ENCOUNTER — Ambulatory Visit
Admission: RE | Admit: 2017-01-17 | Discharge: 2017-01-17 | Disposition: A | Discharge status: Home / Self Care | Payer: Non-veteran care | Source: Ambulatory Visit | Attending: Radiation Oncology | Admitting: Radiation Oncology

## 2017-01-17 DIAGNOSIS — Z510 Encounter for antineoplastic radiation therapy: Secondary | ICD-10-CM | POA: Diagnosis not present

## 2017-01-18 ENCOUNTER — Ambulatory Visit
Admission: RE | Admit: 2017-01-18 | Discharge: 2017-01-18 | Disposition: A | Discharge status: Home / Self Care | Payer: Non-veteran care | Source: Ambulatory Visit | Attending: Radiation Oncology | Admitting: Radiation Oncology

## 2017-01-18 DIAGNOSIS — Z510 Encounter for antineoplastic radiation therapy: Secondary | ICD-10-CM | POA: Diagnosis not present

## 2017-01-19 ENCOUNTER — Ambulatory Visit
Admission: RE | Admit: 2017-01-19 | Discharge: 2017-01-19 | Disposition: A | Discharge status: Home / Self Care | Payer: Non-veteran care | Source: Ambulatory Visit | Attending: Radiation Oncology | Admitting: Radiation Oncology

## 2017-01-19 DIAGNOSIS — Z510 Encounter for antineoplastic radiation therapy: Secondary | ICD-10-CM | POA: Diagnosis not present

## 2017-01-20 ENCOUNTER — Ambulatory Visit
Admission: RE | Admit: 2017-01-20 | Discharge: 2017-01-20 | Disposition: A | Discharge status: Home / Self Care | Payer: Non-veteran care | Source: Ambulatory Visit | Attending: Radiation Oncology | Admitting: Radiation Oncology

## 2017-01-20 DIAGNOSIS — Z510 Encounter for antineoplastic radiation therapy: Secondary | ICD-10-CM | POA: Diagnosis not present

## 2017-01-25 ENCOUNTER — Ambulatory Visit: Payer: Non-veteran care

## 2017-01-25 ENCOUNTER — Ambulatory Visit
Admission: RE | Admit: 2017-01-25 | Discharge: 2017-01-25 | Disposition: A | Discharge status: Home / Self Care | Payer: Non-veteran care | Source: Ambulatory Visit | Attending: Radiation Oncology | Admitting: Radiation Oncology

## 2017-01-25 ENCOUNTER — Encounter: Payer: Self-pay | Admitting: Radiation Oncology

## 2017-01-25 DIAGNOSIS — Z510 Encounter for antineoplastic radiation therapy: Secondary | ICD-10-CM | POA: Diagnosis not present

## 2017-01-26 ENCOUNTER — Ambulatory Visit: Payer: Non-veteran care

## 2017-01-27 NOTE — Progress Notes (Signed)
  Radiation Oncology         [phone]) [phone] ________________________________  Name: Steve Mcmahon. MRN: [id number]  Date: 01/25/2017  DOB: 12/16/1960  End of Treatment Note  Diagnosis:   56 y.o. male with Stage T1c adenocarcinoma of the prostate with Gleason Score of 3+4, and PSA of 5.63  Indication for treatment:  Curative, Definitive Radiotherapy       Radiation treatment dates:   11/30/2016 - 01/25/2017  Site/dose:   The prostate was treated to 78 Gy in 40 fractions of 1.95 Gy  Beams/energy:   The patient was treated with IMRT using volumetric arc therapy delivering 6 MV X-rays to clockwise and counterclockwise circumferential arcs with a 90 degree collimator offset to avoid dose scalloping.  Image guidance was performed with daily cone beam CT prior to each fraction to align to gold markers in the prostate and assure proper bladder and rectal fill volumes.  Immobilization was achieved with BodyFix custom mold.  Narrative: The patient tolerated radiation treatment relatively well.   The patient experienced modest fatigue and some minor urinary irritation, including nocturia x1. He denied any dysuria, hematuria, weak stream, difficulty emptying, urgency, or leakage. He also denied any bowel issues.  Plan: The patient has completed radiation treatment. He will return to radiation oncology clinic for routine followup in one month. I advised him to call or return sooner if he has any questions or concerns related to his recovery or treatment. ________________________________  Sheral Apley. Tammi Klippel, M.D.  This document serves as a record of services personally performed by Tyler Pita, MD. It was created on his behalf by Rae Lips, a trained medical scribe. The creation of this record is based on the scribe's personal observations and the provider's statements to them. This document has been checked and approved by the attending provider.

## 2017-02-02 ENCOUNTER — Encounter: Payer: Self-pay | Admitting: *Deleted

## 2017-02-02 NOTE — Progress Notes (Signed)
On 02-02-17 [facility]

## 2017-03-05 ENCOUNTER — Other Ambulatory Visit: Payer: Self-pay

## 2017-03-05 ENCOUNTER — Ambulatory Visit
Admission: RE | Admit: 2017-03-05 | Discharge: 2017-03-05 | Disposition: A | Discharge status: Home / Self Care | Payer: Non-veteran care | Source: Ambulatory Visit | Attending: Urology | Admitting: Urology

## 2017-03-05 ENCOUNTER — Encounter: Payer: Self-pay | Admitting: Urology

## 2017-03-05 VITALS — BP 112/70 | HR 80 | Temp 97.6°F | Resp 20 | Ht 75.0 in | Wt 244.2 lb

## 2017-03-05 DIAGNOSIS — Z79899 Other long term (current) drug therapy: Secondary | ICD-10-CM | POA: Diagnosis not present

## 2017-03-05 DIAGNOSIS — Z880 Allergy status to penicillin: Secondary | ICD-10-CM | POA: Insufficient documentation

## 2017-03-05 DIAGNOSIS — Z923 Personal history of irradiation: Secondary | ICD-10-CM | POA: Insufficient documentation

## 2017-03-05 DIAGNOSIS — C61 Malignant neoplasm of prostate: Secondary | ICD-10-CM

## 2017-03-05 DIAGNOSIS — Z791 Long term (current) use of non-steroidal anti-inflammatories (NSAID): Secondary | ICD-10-CM | POA: Diagnosis not present

## 2017-03-05 NOTE — Progress Notes (Signed)
Radiation Oncology         [phone]) [phone] ________________________________  Name: Steve Mcmahon. MRN: [id number]  Date: 03/05/2017  DOB: 12-19-1960  Post Treatment Note  CC: Armour, [name], MD  Precious Bard, FNP  Diagnosis:   57 y.o. male with Stage T1c adenocarcinoma of the prostate with Gleason Score of 3+4, and PSA of 5.63  Interval Since Last Radiation:  5 weeks  11/30/2016 - 01/25/2017: The prostate was treated to 78 Gy in 40 fractions of 1.95 Gy  Narrative:  The patient returns today for routine follow-up. He tolerated radiation treatment relatively well.   The patient experienced modest fatigue and some minor urinary irritation, including nocturia x1. He denied any dysuria, hematuria, weak stream, difficulty emptying, urgency, or leakage. He also denied any bowel issues.                           On review of systems, the patient states that he is doing very well overall. He has had complete resolution of lower urinary tract symptoms and nocturia is back to his baseline of 1x/night.  His current IPSS score is 1 indicating mild symptoms. Specifically denies dysuria, gross hematuria, increased daytime frequency, urgency, weak stream, hesitancy, intermittency or incontinence. He denies any bowel disturbances. He reports a healthy appetite and is maintaining his weight. He denies abdominal pain, nausea, vomiting or diarrhea.  ALLERGIES:  is allergic to penicillins.  Meds: Current Outpatient Medications  Medication Sig Dispense Refill  . Ascorbic Acid (VITAMIN C) 1000 MG tablet Take 1,000 mg by mouth daily.    Marland Kitchen b complex vitamins tablet Take 1 tablet by mouth daily.    . Calcium Carbonate-Vitamin D (CALCIUM 600 + D PO) Take 1 tablet by mouth 2 (two) times daily.    . Ibuprofen-Diphenhydramine Cit (ADVIL PM PO) Take by mouth.    Marland Kitchen LORazepam (ATIVAN) 1 MG tablet Take 1 mg by mouth.    . Multiple Vitamin (MULTIVITAMIN WITH MINERALS) TABS Take 1 tablet by mouth daily.    Marland Kitchen OVER THE  COUNTER MEDICATION Take 1 capsule by mouth as needed. For gas/bloating     No current facility-administered medications for this encounter.     Physical Findings:  height is 6\' 3"  (1.905 m) and weight is 244 lb 3.2 oz (110.8 kg). His oral temperature is 97.6 F (36.4 C). His blood pressure is 112/70 and his pulse is 80. His respiration is 20 and oxygen saturation is 96%.  Pain Assessment Pain Score: 0-No pain/10 In general this is a well appearing caucasian male in no acute distress. He's alert and oriented x4 and appropriate throughout the examination. Cardiopulmonary assessment is negative for acute distress and he exhibits normal effort.   Lab Findings: Lab Results  Component Value Date   WBC 5.7 10/01/2011   HGB 14.9 10/01/2011   HCT 43.0 10/01/2011   MCV 88.5 10/01/2011   PLT 269 10/01/2011     Radiographic Findings: No results found.  Impression/Plan: 1. 57 y.o. male with Stage T1c adenocarcinoma of the prostate with Gleason Score of 3+4, and PSA of 5.63.  He will continue to follow up with urology for ongoing PSA determinations and has an appointment scheduled with Dr. Malissa Hippo Smith,NP at the [facility] [place] on 04/02/17. He understands what to expect with regards to PSA monitoring going forward. I will look forward to following his response to treatment via correspondence with urology, and would be happy to continue  to participate in his care if clinically indicated. I talked to the patient about what to expect in the future, including his risk for erectile dysfunction and rectal bleeding. I encouraged him to call or return to the office if he has any questions regarding his previous radiation or possible radiation side effects. He was comfortable with this plan and will follow up as needed.    Nicholos Johns, PA-C

## 2017-03-15 ENCOUNTER — Telehealth: Payer: Self-pay | Admitting: Radiation Oncology

## 2017-03-15 NOTE — Telephone Encounter (Signed)
Patient phoned requesting a return call. Phoned patient back. Patient explains that he can go to the [place] anytime to have his PSA drawn but questions when he should go for the most accurate result following radiation. Patient completed radiation therapy on 01/25/2017. Explained a PSA drawn after 04/27/17 (3 months) is most reflective of how well he has responded to radiation therapy. Patient verbalized understanding and expressed appreciation for the call.

## 2017-04-28 ENCOUNTER — Encounter: Payer: Self-pay | Admitting: *Deleted

## 2017-04-28 NOTE — Progress Notes (Signed)
On [facility] smith

## 2017-04-29 ENCOUNTER — Other Ambulatory Visit: Payer: Self-pay | Admitting: Orthopedic Surgery

## 2017-04-29 DIAGNOSIS — M19012 Primary osteoarthritis, left shoulder: Secondary | ICD-10-CM

## 2017-05-04 ENCOUNTER — Telehealth: Payer: Self-pay | Admitting: Urology

## 2017-05-04 NOTE — Telephone Encounter (Signed)
I called and left a message on the patient's voicemail informing him that, from a radiation standpoint, he is free to move forward with shoulder surgery without restriction.  I advised him to call with any further questions or concerns.   Nicholos Johns, PA-C

## 2017-05-04 NOTE — Telephone Encounter (Signed)
-----   Message from Kerri Perches sent at 05/03/2017  3:53 PM EST ----- Regarding: PHONE [place],   Please call the above patient @ [phone].  Steve Mcmahon wants to know if he can have shoulder surgery?  Please call on 05-04-17.  Thanks,  [employer]

## 2017-05-13 ENCOUNTER — Telehealth: Payer: Self-pay | Admitting: Radiation Oncology

## 2017-05-13 NOTE — Telephone Encounter (Signed)
As requested by Freeman Caldron, PA-C I phoned to inquire about his concerns reference his PSA. No answer. Left message with my contact information and requested he return my call.

## 2017-05-13 NOTE — Telephone Encounter (Signed)
-----   Message from Freeman Caldron, [place] sent at 05/12/2017  5:21 PM EDT ----- Regarding: FW: psa [name], Will you please give this patient a call to get more information regarding his concerns? Ailene Ards ----- Message ----- From: Kerri Perches Sent: 05/12/2017   2:29 PM To: Freeman Caldron, PA-C Subject: psa                                            Hi Ashlyn,   The above patient wants you to give him a call, he is concerned about his PSA.  Mr. Beavers phone number is [phone].  Thanks,  [employer]

## 2017-05-14 ENCOUNTER — Encounter: Payer: Self-pay | Admitting: Radiation Oncology

## 2017-05-14 ENCOUNTER — Ambulatory Visit
Admission: RE | Admit: 2017-05-14 | Discharge: 2017-05-14 | Disposition: A | Discharge status: Home / Self Care | Payer: Non-veteran care | Source: Ambulatory Visit | Attending: Orthopedic Surgery | Admitting: Orthopedic Surgery

## 2017-05-14 DIAGNOSIS — M19012 Primary osteoarthritis, left shoulder: Secondary | ICD-10-CM

## 2017-05-14 NOTE — Progress Notes (Signed)
Patient presented to the clinic today without an appointment. Patient requesting to see this RN. Patient reports he has questions about his PSA. Prior to radiation therapy his PSA was 5.63. Patient reports the PSA drawn by the [facility] one month s/p completion of xrt was 3.03 and the next month was 3.16. Reinforced that the PSA collected three months s/p completion is most reflective of how well he has responded to radiation. Also, reinforced his PSA can slightly move up and down for up to two years s/p completion. Stressed that he is trending down and this is a strong indicator he responded well to radiation. Patient verbalized understanding.

## 2017-07-11 ENCOUNTER — Other Ambulatory Visit: Payer: Self-pay

## 2017-07-11 ENCOUNTER — Emergency Department (HOSPITAL_COMMUNITY): Payer: No Typology Code available for payment source

## 2017-07-11 ENCOUNTER — Encounter (HOSPITAL_COMMUNITY): Payer: Self-pay | Admitting: *Deleted

## 2017-07-11 ENCOUNTER — Emergency Department (HOSPITAL_COMMUNITY)
Admission: EM | Admit: 2017-07-11 | Discharge: 2017-07-11 | Disposition: A | Discharge status: Home / Self Care | Payer: No Typology Code available for payment source | Attending: Emergency Medicine | Admitting: Emergency Medicine

## 2017-07-11 DIAGNOSIS — Z79899 Other long term (current) drug therapy: Secondary | ICD-10-CM | POA: Insufficient documentation

## 2017-07-11 DIAGNOSIS — Z96612 Presence of left artificial shoulder joint: Secondary | ICD-10-CM | POA: Insufficient documentation

## 2017-07-11 DIAGNOSIS — K668 Other specified disorders of peritoneum: Secondary | ICD-10-CM | POA: Insufficient documentation

## 2017-07-11 DIAGNOSIS — R102 Pelvic and perineal pain: Secondary | ICD-10-CM | POA: Diagnosis present

## 2017-07-11 DIAGNOSIS — R109 Unspecified abdominal pain: Secondary | ICD-10-CM

## 2017-07-11 DIAGNOSIS — IMO0002 Reserved for concepts with insufficient information to code with codable children: Secondary | ICD-10-CM

## 2017-07-11 DIAGNOSIS — N189 Chronic kidney disease, unspecified: Secondary | ICD-10-CM | POA: Diagnosis not present

## 2017-07-11 LAB — COMPREHENSIVE METABOLIC PANEL
ALBUMIN: 4.1 g/dL (ref 3.5–5.0)
ALK PHOS: 55 U/L (ref 38–126)
ALT: 39 U/L (ref 17–63)
ANION GAP: 6 (ref 5–15)
AST: 67 U/L — ABNORMAL HIGH (ref 15–41)
BUN: 17 mg/dL (ref 6–20)
CALCIUM: 8.9 mg/dL (ref 8.9–10.3)
CO2: 28 mmol/L (ref 22–32)
Chloride: 104 mmol/L (ref 101–111)
Creatinine, Ser: 0.86 mg/dL (ref 0.61–1.24)
GFR calc Af Amer: >60 mL/min (ref >60)
GFR calc non Af Amer: >60 mL/min (ref >60)
Glucose, Bld: 111 mg/dL — ABNORMAL HIGH (ref 65–99)
POTASSIUM: 4.1 mmol/L (ref 3.5–5.1)
SODIUM: 138 mmol/L (ref 135–145)
TOTAL PROTEIN: 6.8 g/dL (ref 6.5–8.1)
Total Bilirubin: 0.9 mg/dL (ref 0.3–1.2)

## 2017-07-11 LAB — URINALYSIS, ROUTINE W REFLEX MICROSCOPIC
Bilirubin Urine: NEGATIVE
GLUCOSE, UA: NEGATIVE mg/dL
Hgb urine dipstick: NEGATIVE
KETONES UR: NEGATIVE mg/dL
LEUKOCYTES UA: NEGATIVE
Nitrite: NEGATIVE
PH: 6 (ref 5.0–8.0)
Protein, ur: NEGATIVE mg/dL
Specific Gravity, Urine: 1.01 (ref 1.005–1.030)

## 2017-07-11 LAB — CBC
HEMATOCRIT: 43.1 % (ref 39.0–52.0)
HEMOGLOBIN: 14.9 g/dL (ref 13.0–17.0)
MCH: 31.2 pg (ref 26.0–34.0)
MCHC: 34.6 g/dL (ref 30.0–36.0)
MCV: 90.2 fL (ref 78.0–100.0)
Platelets: 211 10*3/uL (ref 150–400)
RBC: 4.78 MIL/uL (ref 4.22–5.81)
RDW: 12.8 % (ref 11.5–15.5)
WBC: 5.8 10*3/uL (ref 4.0–10.5)

## 2017-07-11 LAB — LIPASE, BLOOD: Lipase: 43 U/L (ref 11–51)

## 2017-07-11 MED ORDER — SODIUM CHLORIDE 0.9 % IV SOLN: 1000.0000 mL | INTRAVENOUS | Status: DC

## 2017-07-11 MED ORDER — SODIUM CHLORIDE 0.9 % IV BOLUS (SEPSIS)
500.0000 mL | Freq: Once | INTRAVENOUS | Status: AC
Start: 2017-07-11 — End: 2017-07-11
  Administered 2017-07-11: 500 mL via INTRAVENOUS

## 2017-07-11 MED ORDER — IOHEXOL 300 MG/ML  SOLN
100.0000 mL | Freq: Once | INTRAMUSCULAR | Status: AC | PRN
  Administered 2017-07-11: 100 mL via INTRAVENOUS

## 2017-07-11 NOTE — Discharge Instructions (Signed)
Follow-up with your primary care doctor to arrange for an outpatient MRI to evaluate the abdominal cyst further.  Take over-the-counter medications as needed for pain.
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
## 2017-07-11 NOTE — ED Notes (Signed)
Results reviewed.  No changes in acuity at this time 

## 2017-07-11 NOTE — ED Triage Notes (Signed)
The pt is c/o abd pain and bloating for 2-3 days  Unable to pass gas since this am  Last normal bm yesterday  No n v or diarrhea  He just finished   3o0  Radiation treatments for prostate cancer

## 2017-09-16 ENCOUNTER — Inpatient Hospital Stay: Admit: 2017-09-16 | Payer: Non-veteran care | Admitting: Orthopedic Surgery

## 2017-09-16 SURGERY — ARTHROPLASTY, SHOULDER, TOTAL
Anesthesia: General | Site: Shoulder | Laterality: Left

## 2019-10-17 ENCOUNTER — Encounter (HOSPITAL_COMMUNITY): Payer: Self-pay | Admitting: Emergency Medicine

## 2019-10-17 ENCOUNTER — Emergency Department (HOSPITAL_COMMUNITY)
Admission: EM | Admit: 2019-10-17 | Discharge: 2019-10-17 | Disposition: A | Discharge status: Home / Self Care | Payer: No Typology Code available for payment source | Attending: Emergency Medicine | Admitting: Emergency Medicine

## 2019-10-17 ENCOUNTER — Emergency Department (HOSPITAL_COMMUNITY): Payer: No Typology Code available for payment source

## 2019-10-17 DIAGNOSIS — S80212A Abrasion, left knee, initial encounter: Secondary | ICD-10-CM | POA: Diagnosis not present

## 2019-10-17 DIAGNOSIS — Y929 Unspecified place or not applicable: Secondary | ICD-10-CM | POA: Insufficient documentation

## 2019-10-17 DIAGNOSIS — W010XXA Fall on same level from slipping, tripping and stumbling without subsequent striking against object, initial encounter: Secondary | ICD-10-CM | POA: Diagnosis not present

## 2019-10-17 DIAGNOSIS — Y939 Activity, unspecified: Secondary | ICD-10-CM | POA: Insufficient documentation

## 2019-10-17 DIAGNOSIS — Z5321 Procedure and treatment not carried out due to patient leaving prior to being seen by health care provider: Secondary | ICD-10-CM | POA: Insufficient documentation

## 2019-10-17 DIAGNOSIS — S0512XA Contusion of eyeball and orbital tissues, left eye, initial encounter: Secondary | ICD-10-CM | POA: Diagnosis not present

## 2019-10-17 DIAGNOSIS — Y999 Unspecified external cause status: Secondary | ICD-10-CM | POA: Diagnosis not present

## 2019-10-17 NOTE — ED Triage Notes (Signed)
BIB EMS from home. Patient slipped and fell earlier today. Patient was evaluated by EMS and patient refused transport. After returning home, SO became concerned because she said patient was altered. Patient is A/OX4 in triage. Presents with mild abrasion to L knee, mild abrasion to L cheek and bruising under L eye.

## 2019-10-17 NOTE — ED Notes (Signed)
Patient requesting to go d/t wait time. Advised patient against leaving.

## 2020-03-04 ENCOUNTER — Other Ambulatory Visit: Payer: Self-pay

## 2020-03-04 ENCOUNTER — Encounter (HOSPITAL_COMMUNITY): Payer: Self-pay | Admitting: *Deleted

## 2020-03-04 ENCOUNTER — Emergency Department (HOSPITAL_COMMUNITY)
Admission: EM | Admit: 2020-03-04 | Discharge: 2020-03-04 | Disposition: A | Discharge status: Home / Self Care | Payer: No Typology Code available for payment source | Attending: Emergency Medicine | Admitting: Emergency Medicine

## 2020-03-04 DIAGNOSIS — S0990XA Unspecified injury of head, initial encounter: Secondary | ICD-10-CM | POA: Insufficient documentation

## 2020-03-04 DIAGNOSIS — W1839XA Other fall on same level, initial encounter: Secondary | ICD-10-CM | POA: Insufficient documentation

## 2020-03-04 DIAGNOSIS — Z5321 Procedure and treatment not carried out due to patient leaving prior to being seen by health care provider: Secondary | ICD-10-CM | POA: Insufficient documentation

## 2020-03-04 NOTE — ED Triage Notes (Signed)
Pt fell and hit the left side of his head on hardwood floor 2 days ago. He lost his balance and fell. Hx of balance issues since head injury several years ago. He reports his vision became blurry yesterday. No loss of consciousness, not on blood thinners.

## 2020-03-19 ENCOUNTER — Ambulatory Visit: Payer: Self-pay

## 2020-03-19 ENCOUNTER — Ambulatory Visit: Payer: Self-pay | Admitting: *Deleted

## 2020-03-19 NOTE — Telephone Encounter (Signed)
  C/o constipation and only having "mushy" stools. patient reports he had bm last night but was loose. Patient took suppository and bm was loose. Denies abdominal pain and bloating now but reports he did have issues for a few days ago. C/o headaches, denies fever, abdominal pain, vomiting. Care advise given. If care advise does not produce bm encouraged patent to go to PCP or ED. Patient verbalized understanding of care advise and to call back or go to ED if symptoms worsen.   Reason for Disposition . Unable to have a bowel movement (BM) without laxative or enema  Answer Assessment - Initial Assessment Questions 1. STOOL PATTERN OR FREQUENCY: "How often do you pass bowel movements (BMs)?"  (Normal range: tid to q 3 days)  "When was the last BM passed?"       Every day 2. STRAINING: "Do you have to strain to have a BM?"      no 3. RECTAL PAIN: "Does your rectum hurt when the stool comes out?" If Yes, ask: "Do you have hemorrhoids? How bad is the pain?"  (Scale 1-10; or mild, moderate, severe)     no 4. STOOL COMPOSITION: "Are the stools hard?"      No  5. BLOOD ON STOOLS: "Has there been any blood on the toilet tissue or on the surface of the BM?" If Yes, ask: "When was the last time?"      No  6. CHRONIC CONSTIPATION: "Is this a new problem for you?"  If no, ask: "How long have you had this problem?" (days, weeks, months)      No  7. CHANGES IN DIET OR HYDRATION: "Have there been any recent changes in your diet?" "How much fluids are you drinking consuming on a daily basis?"  "How much have you had to drink today?"     No , am drinking fluids  8. MEDICATIONS: "Have you been taking any new medications?" "Are you taking any narcotic pain medications?" (e.g., Vicoden, Percocet, morphine, dilaudid)     denies 9. LAXATIVES: "Have you been using any stool softeners, laxatives, or enemas?"  If yes, ask "What, how often, and when was the last time?" 10.ACTIVITY:  "How much walking do you do every day?  on a daily basis?"  "Has your activity level decreased in the past week?"        Activity has changed due to weather and cold outside. No walking as much 11. CAUSE: "What do you think is causing the constipation?"        No sure  12. OTHER SYMPTOMS: "Do you have any other symptoms?" (e.g., abdominal pain, bloating, fever, vomiting)       No symptoms now but did have abdominal pain and bloating 13. MEDICAL HISTORY: "Do you have a history of hemorrhoids, rectal fissures, or rectal surgery or rectal abscess?"         hemorrhoid surgery 10 years ago  52. PREGNANCY: "Is there any chance you are pregnant?" "When was your last menstrual period?"       na  Protocols used: CONSTIPATION-A-AH

## 2020-03-19 NOTE — Telephone Encounter (Signed)
Called and spoke with Romie Minus Daily.  She states that she is girlfriend of patient. She states that he is sleeping now and would like to call back.  She states that she originally called to get a virtual visit but was told instead to speak with a nurse.  She was calling for information on constipation and impaction for patient.

## 2020-03-19 NOTE — Telephone Encounter (Signed)
3 rd attempt to contact patient. Called and pick up heard and hung up x 2 . Unable to leave message. Will allow patient to call back if assistance still needed.
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
## 2020-03-20 ENCOUNTER — Ambulatory Visit: Payer: Self-pay

## 2021-01-27 ENCOUNTER — Emergency Department (HOSPITAL_COMMUNITY): Payer: No Typology Code available for payment source

## 2021-01-27 ENCOUNTER — Encounter (HOSPITAL_COMMUNITY): Payer: Self-pay

## 2021-01-27 ENCOUNTER — Emergency Department (HOSPITAL_COMMUNITY)
Admission: EM | Admit: 2021-01-27 | Discharge: 2021-01-28 | Disposition: A | Discharge status: Home / Self Care | Payer: No Typology Code available for payment source | Attending: Student | Admitting: Student

## 2021-01-27 ENCOUNTER — Other Ambulatory Visit: Payer: Self-pay

## 2021-01-27 DIAGNOSIS — H532 Diplopia: Secondary | ICD-10-CM | POA: Insufficient documentation

## 2021-01-27 DIAGNOSIS — Z5321 Procedure and treatment not carried out due to patient leaving prior to being seen by health care provider: Secondary | ICD-10-CM | POA: Insufficient documentation
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
## 2021-01-28 NOTE — ED Notes (Signed)
X2 no response °

## 2021-01-28 NOTE — ED Notes (Signed)
X1 for vitals recheck with no response 

## 2021-10-27 ENCOUNTER — Encounter: Payer: Self-pay | Admitting: Plastic Surgery

## 2021-10-27 ENCOUNTER — Ambulatory Visit (INDEPENDENT_AMBULATORY_CARE_PROVIDER_SITE_OTHER): Payer: No Typology Code available for payment source | Admitting: Plastic Surgery

## 2021-10-27 VITALS — BP 110/74 | HR 72 | Temp 96.0°F | Ht 74.0 in | Wt 230.0 lb

## 2021-10-27 DIAGNOSIS — L989 Disorder of the skin and subcutaneous tissue, unspecified: Secondary | ICD-10-CM | POA: Diagnosis not present

## 2021-10-27 DIAGNOSIS — D489 Neoplasm of uncertain behavior, unspecified: Secondary | ICD-10-CM
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
## 2022-01-01 ENCOUNTER — Ambulatory Visit: Payer: No Typology Code available for payment source | Admitting: Plastic Surgery

## 2022-06-01 ENCOUNTER — Emergency Department (HOSPITAL_COMMUNITY): Payer: No Typology Code available for payment source

## 2022-06-01 ENCOUNTER — Emergency Department (HOSPITAL_COMMUNITY)
Admission: EM | Admit: 2022-06-01 | Discharge: 2022-06-02 | Disposition: A | Discharge status: Home / Self Care | Payer: No Typology Code available for payment source | Attending: Emergency Medicine | Admitting: Emergency Medicine

## 2022-06-01 ENCOUNTER — Encounter (HOSPITAL_COMMUNITY): Payer: Self-pay

## 2022-06-01 DIAGNOSIS — R1084 Generalized abdominal pain: Secondary | ICD-10-CM | POA: Insufficient documentation

## 2022-06-01 DIAGNOSIS — R109 Unspecified abdominal pain: Secondary | ICD-10-CM | POA: Diagnosis present

## 2022-06-01 LAB — URINALYSIS, ROUTINE W REFLEX MICROSCOPIC
Bacteria, UA: NONE SEEN
Bilirubin Urine: NEGATIVE
Glucose, UA: NEGATIVE mg/dL
Hgb urine dipstick: NEGATIVE
Ketones, ur: NEGATIVE mg/dL
Leukocytes,Ua: NEGATIVE
Nitrite: NEGATIVE
Protein, ur: NEGATIVE mg/dL
Specific Gravity, Urine: 1.011 (ref 1.005–1.030)
pH: 5 (ref 5.0–8.0)

## 2022-06-01 LAB — CBC WITH DIFFERENTIAL/PLATELET
Abs Immature Granulocytes: 0.02 10*3/uL (ref 0.00–0.07)
Basophils Absolute: 0 10*3/uL (ref 0.0–0.1)
Basophils Relative: 0 %
Eosinophils Absolute: 0.1 10*3/uL (ref 0.0–0.5)
Eosinophils Relative: 1 %
HCT: 46 % (ref 39.0–52.0)
Hemoglobin: 15.8 g/dL (ref 13.0–17.0)
Immature Granulocytes: 0 %
Lymphocytes Relative: 29 %
Lymphs Abs: 1.7 10*3/uL (ref 0.7–4.0)
MCH: 30.9 pg (ref 26.0–34.0)
MCHC: 34.3 g/dL (ref 30.0–36.0)
MCV: 89.8 fL (ref 80.0–100.0)
Monocytes Absolute: 0.5 10*3/uL (ref 0.1–1.0)
Monocytes Relative: 9 %
Neutro Abs: 3.5 10*3/uL (ref 1.7–7.7)
Neutrophils Relative %: 61 %
Platelets: 275 10*3/uL (ref 150–400)
RBC: 5.12 MIL/uL (ref 4.22–5.81)
RDW: 12.3 % (ref 11.5–15.5)
WBC: 5.9 10*3/uL (ref 4.0–10.5)
nRBC: 0 % (ref 0.0–0.2)

## 2022-06-01 LAB — COMPREHENSIVE METABOLIC PANEL
ALT: 20 U/L (ref 0–44)
AST: 23 U/L (ref 15–41)
Albumin: 4.7 g/dL (ref 3.5–5.0)
Alkaline Phosphatase: 66 U/L (ref 38–126)
Anion gap: 10 (ref 5–15)
BUN: 16 mg/dL (ref 8–23)
CO2: 25 mmol/L (ref 22–32)
Calcium: 9.3 mg/dL (ref 8.9–10.3)
Chloride: 104 mmol/L (ref 98–111)
Creatinine, Ser: 0.8 mg/dL (ref 0.61–1.24)
GFR, Estimated: >60 mL/min (ref >60)
Glucose, Bld: 93 mg/dL (ref 70–99)
Potassium: 3.8 mmol/L (ref 3.5–5.1)
Sodium: 139 mmol/L (ref 135–145)
Total Bilirubin: 0.8 mg/dL (ref 0.3–1.2)
Total Protein: 7.3 g/dL (ref 6.5–8.1)

## 2022-06-01 LAB — LIPASE, BLOOD: Lipase: 29 U/L (ref 11–51)

## 2022-06-01 MED ORDER — IOHEXOL 300 MG/ML  SOLN
100.0000 mL | Freq: Once | INTRAMUSCULAR | Status: AC | PRN
  Administered 2022-06-01: 100 mL via INTRAVENOUS

## 2022-06-01 MED ORDER — DICYCLOMINE HCL 10 MG PO CAPS
20.0000 mg | ORAL_CAPSULE | Freq: Once | ORAL | Status: AC
  Administered 2022-06-01: 20 mg via ORAL
  Filled 2022-06-01: qty 2

## 2022-06-01 MED ORDER — METOCLOPRAMIDE HCL 5 MG/ML IJ SOLN
10.0000 mg | Freq: Once | INTRAMUSCULAR | Status: AC
  Administered 2022-06-01: 10 mg via INTRAVENOUS
  Filled 2022-06-01: qty 2

## 2022-06-01 MED ORDER — DIPHENHYDRAMINE HCL 25 MG PO CAPS
25.0000 mg | ORAL_CAPSULE | Freq: Once | ORAL | Status: AC
  Administered 2022-06-01: 25 mg via ORAL
  Filled 2022-06-01: qty 1

## 2022-06-01 NOTE — ED Notes (Signed)
Patient transported to CT 

## 2022-06-01 NOTE — ED Triage Notes (Signed)
Pt c/o abd pain and gas x 2 weeks. Pt was sent from the [place], who did an abd series on him, showing few air fluid levels within the mid abd. Pt was sent over to r/o SBO. Pt c/o constipation.

## 2022-06-01 NOTE — ED Provider Notes (Incomplete)
[facility] Provider Note   CSN: [id number] Arrival date & time: 06/01/22  1749     History {Add pertinent medical, surgical, social history, OB history to HPI:1} Chief Complaint  Patient presents with  . Abdominal Pain    Zee Vill. is a 62 y.o. male.   Abdominal Pain Patient is a 62 year old male present emergency room today with complaints of 2 to 3 months of intermittent abdominal pain.  He states that he has had some loose stool today but decreased BMs in general.  Denies any urinary frequency urgency dysuria hematuria.  No fevers chills nausea or vomiting.  He was seen at his [place] PCP and referred to an urgent care who referred him to the emergency room because they are confirmed for a small bowel obstruction. He states that he currently feels generally well states that this pain is 1 or 2 out of 10.  Denies any nausea or vomiting.  No other associate symptoms currently.  No testicular pain no abdominal trauma.    Home Medications Prior to Admission medications   Medication Sig Start Date End Date Taking? Authorizing Provider  Ascorbic Acid (VITAMIN C) 1000 MG tablet Take 1,000 mg by mouth daily.    [provider]  b complex vitamins tablet Take 1 tablet by mouth daily.    [provider]  Calcium Carbonate-Vitamin D (CALCIUM 600 + D PO) Take 1 tablet by mouth 2 (two) times daily. Patient not taking: Reported on 10/27/2021    [provider]  Glutamine POWD Take by mouth. In coffee every morning    [provider]  LORazepam (ATIVAN) 1 MG tablet Take 1 mg by mouth.    [provider]      Allergies    Penicillins    Review of Systems   Review of Systems  Gastrointestinal:  Positive for abdominal pain.    Physical Exam Updated Vital Signs BP 121/82 (BP Location: Right Arm)   Pulse 60   Temp 97.7 F (36.5 C) (Oral)   Resp 18   Ht 6\' 2"  (1.88 m)   Wt 99.8 kg   SpO2  98%   BMI 28.25 kg/m  Physical Exam Vitals and nursing note reviewed.  Constitutional:      General: He is not in acute distress. HENT:     Head: Normocephalic and atraumatic.     Nose: Nose normal.  Eyes:     General: No scleral icterus. Cardiovascular:     Rate and Rhythm: Normal rate and regular rhythm.     Pulses: Normal pulses.     Heart sounds: Normal heart sounds.  Pulmonary:     Effort: Pulmonary effort is normal. No respiratory distress.     Breath sounds: No wheezing.  Abdominal:     Palpations: Abdomen is soft.     Tenderness: There is no abdominal tenderness.  Musculoskeletal:     Cervical back: Normal range of motion.     Right lower leg: No edema.     Left lower leg: No edema.  Skin:    General: Skin is warm and dry.     Capillary Refill: Capillary refill takes less than 2 seconds.  Neurological:     Mental Status: He is alert. Mental status is at baseline.  Psychiatric:        Mood and Affect: Mood normal.        Behavior: Behavior normal.     ED Results /  Procedures / Treatments   Labs (all labs ordered are listed, but only abnormal results are displayed) Labs Reviewed  CBC WITH DIFFERENTIAL/PLATELET  COMPREHENSIVE METABOLIC PANEL  LIPASE, BLOOD  URINALYSIS, ROUTINE W REFLEX MICROSCOPIC    EKG None  Radiology CT ABDOMEN PELVIS W CONTRAST  Result Date: 06/01/2022 CLINICAL DATA:  Lower abdominal pain and loose stools, recurrent times 2-3 months. Previous history of prostate cancer. Sent to the hospital from [facility] to rule out small bowel obstruction. EXAM: CT ABDOMEN AND PELVIS WITH CONTRAST TECHNIQUE: Multidetector CT imaging of the abdomen and pelvis was performed using the standard protocol following bolus administration of intravenous contrast. RADIATION DOSE REDUCTION: This exam was performed according to the departmental dose-optimization program which includes automated exposure control, adjustment of the mA and/or kV according to patient size  and/or use of iterative reconstruction technique. CONTRAST:  141mL OMNIPAQUE IOHEXOL 300 MG/ML  SOLN COMPARISON:  CT with IV contrast 07/11/2017 FINDINGS: Lower chest: Linear and reticular atelectasis both posterior lung bases. Lung bases are otherwise clear. The cardiac size is normal. There is calcification in the right coronary artery. No pericardial effusion. Hepatobiliary: Again noted inferiorly in hepatic segment 5, there is a stable 2.2 cm liver hemangioma. There is no hepatic mass enhancement. The gallbladder and bile ducts are unremarkable. Pancreas: No abnormality. Spleen: No abnormality.  No splenomegaly. Adrenals/Urinary Tract: There is no adrenal mass. In the lower outer right kidney there is a 1 cm cyst, internal Hounsfield density is 9.2. This was previously 5 mm. Anteriorly in the left kidney there is a 2.7 cm cyst with internal Hounsfield density of 8, was previously 2.2 cm. No follow-up imaging is recommended for either of these. There is no mass enhancement either side. There is symmetric renal excretion on the delayed phase. There is no urinary stone or obstruction. There is no bladder thickening. Stomach/Bowel: There are mild thickened folds in the stomach and left-sided abdominal small bowel consistent with gastroenteritis. No small bowel obstruction or inflammation is seen. An appendix is not seen in this patient. There is a high-riding cecum. Mild constipation. There are colonic diverticula without evidence of focal colitis or diverticulitis. Vascular/Lymphatic: Aortic atherosclerosis. No enlarged abdominal or pelvic lymph nodes. Reproductive: No prostatomegaly. Both testicles are in the scrotal sac. Other: There is no free air, free hemorrhage, free fluid or incarcerated hernia. There is a small umbilical fat hernia. There are small inguinal fat hernias. Musculoskeletal: There is chronic disc collapse and endplate irregularities at L2-3 and L5-S1. Mild hip DJD. No destructive regional bone  lesions. IMPRESSION: 1. Gastroenteritis without evidence of small-bowel obstruction or inflammation. 2. Constipation and diverticulosis. 3. Aortic and coronary artery atherosclerosis. 4. Stable hepatic hemangioma. 5. Umbilical and inguinal fat hernias. 6. Chronic disc collapse and endplate irregularities at L2-3 and L5-S1. Aortic Atherosclerosis (ICD10-I70.0). Electronically Signed   By: Telford Nab M.D.   On: 06/01/2022 23:52    Procedures Procedures  {Document cardiac monitor, telemetry assessment procedure when appropriate:1}  Medications Ordered in ED Medications  dicyclomine (BENTYL) capsule 20 mg (20 mg Oral Given 06/01/22 2255)  metoCLOPramide (REGLAN) injection 10 mg (10 mg Intravenous Given 06/01/22 2303)  diphenhydrAMINE (BENADRYL) capsule 25 mg (25 mg Oral Given 06/01/22 2303)  iohexol (OMNIPAQUE) 300 MG/ML solution 100 mL (100 mLs Intravenous Contrast Given 06/01/22 2309)    ED Course/ Medical Decision Making/ A&P   {   Click here for ABCD2, HEART and other calculatorsREFRESH Note before signing :1}  Medical Decision Making Risk Prescription drug management.   This patient presents to the ED for concern of abd pain, this involves a number of treatment options, and is a complaint that carries with it a moderate to high risk of complications and morbidity. A differential diagnosis was considered for the patient's symptoms which is discussed below:   The causes of generalized abdominal pain include but are not limited to AAA, mesenteric ischemia, appendicitis, diverticulitis, DKA, gastritis, gastroenteritis, AMI, nephrolithiasis, pancreatitis, peritonitis, adrenal insufficiency,lead poisoning, iron toxicity, intestinal ischemia, constipation, UTI,SBO/LBO, splenic rupture, biliary disease, IBD, IBS, PUD, or hepatitis.   Co morbidities: Discussed in HPI   Brief History:  Patient is a 62 year old male present emergency room today with complaints of 2 to 3  months of intermittent abdominal pain.  He states that he has had some loose stool today but decreased BMs in general.  Denies any urinary frequency urgency dysuria hematuria.  No fevers chills nausea or vomiting.  He was seen at his [place] PCP and referred to an urgent care who referred him to the emergency room because they are confirmed for a small bowel obstruction. He states that he currently feels generally well states that this pain is 1 or 2 out of 10.  Denies any nausea or vomiting.  No other associate symptoms currently.  No testicular pain no abdominal trauma.    EMR reviewed including pt PMHx, past surgical history and past visits to ER.   See HPI for more details   Lab Tests:   {Blank single:19197::"I ordered and independently interpreted labs. Labs notable for","I personally reviewed all laboratory work and imaging. Metabolic panel without any acute abnormality specifically kidney function within normal limits and no significant electrolyte abnormalities. CBC without leukocytosis or significant anemia."}   Imaging Studies:  {Blank single:19197::"NAD. I personally reviewed all imaging studies and no acute abnormality found. I agree with radiology interpretation.","Abnormal findings. I personally reviewed all imaging studies. Imaging notable for","No imaging studies ordered for this patient"}    Cardiac Monitoring:  .{Blank single:19197::"The patient was maintained on a cardiac monitor.  I personally viewed and interpreted the cardiac monitored which showed an underlying rhythm of:","NA"} .{Blank single:19197::"EKG non-ischemic","NA"}   Medicines ordered:  I ordered medication including ***  for *** Reevaluation of the patient after these medicines showed that the patient {resolved/improved/worsened:23923::"improved"} I have reviewed the patients home medicines and have made adjustments as needed   Critical Interventions:  .***   Consults/Attending  Physician   .{Blank single:19197::"I requested consultation with ***,  and discussed lab and imaging findings as well as pertinent plan - they recommend: ***","I discussed this case with my attending physician who cosigned this note including patient's presenting symptoms, physical exam, and planned diagnostics and interventions. Attending physician stated agreement with plan or made changes to plan which were implemented."}   Reevaluation:  After the interventions noted above I re-evaluated patient and found that they have :{resolved/improved/worsened:23923::"improved"}   Social Determinants of Health:  Marland Kitchen{Blank single:19197::"Given cab voucher","Social work/case management involved","The patient's social determinants of health were a factor in the care of this patient"}    Problem List / ED Course:  ***   Dispostion:  After consideration of the diagnostic results and the patients response to treatment, I feel that the patent would benefit from ***       Final Clinical Impression(s) / ED Diagnoses Final diagnoses:  None    Rx / DC Orders ED Discharge Orders     None

## 2022-06-01 NOTE — ED Provider Triage Note (Signed)
Emergency Medicine Provider Triage Evaluation Note  Steve Mcmahon. , a 62 y.o. male  was evaluated in triage.  Pt complains of abd pain. Recurrent lower abdominal pain x 2-3 months. Hx of prostate CA.  Report some loose stool today. No fever, chills, nausea, vomiting, dysuria.  Seen at [facility] but sent here to r/o SBO  Review of Systems  Positive: As above Negative: As above  Physical Exam  BP 139/78 (BP Location: Right Arm)   Pulse 79   Temp 98.4 F (36.9 C) (Oral)   Resp 18   Ht 6\' 2"  (1.88 m)   Wt 99.8 kg   SpO2 96%   BMI 28.25 kg/m  Gen:   Awake, no distress   Resp:  Normal effort  MSK:   Moves extremities without difficulty  Other:    Medical Decision Making  Medically screening exam initiated at 6:28 PM.  Appropriate orders placed.  Steve Mcmahon. was informed that the remainder of the evaluation will be completed by another provider, this initial triage assessment does not replace that evaluation, and the importance of remaining in the ED until their evaluation is complete.     Domenic Moras, PA-C 06/01/22 [phone]

## 2022-06-01 NOTE — ED Provider Notes (Signed)
[facility] Provider Note   CSN: [id number] Arrival date & time: 06/01/22  1749     History  Chief Complaint  Patient presents with   Abdominal Pain    Steve Mcmahon. is a 62 y.o. male.   Abdominal Pain Patient is a 62 year old male present emergency room today with complaints of 2 to 3 months of intermittent abdominal pain.  He states that he has had some loose stool today but decreased BMs in general.  Denies any urinary frequency urgency dysuria hematuria.  No fevers chills nausea or vomiting.  He was seen at his [place] PCP and referred to an urgent care who referred him to the emergency room because they are confirmed for a small bowel obstruction. He states that he currently feels generally well states that this pain is 1 or 2 out of 10.  Denies any nausea or vomiting.  No other associate symptoms currently.  No testicular pain no abdominal trauma.    Home Medications Prior to Admission medications   Medication Sig Start Date End Date Taking? Authorizing Provider  dicyclomine (BENTYL) 20 MG tablet Take 1 tablet (20 mg total) by mouth 2 (two) times daily. 06/02/22  Yes Caliya Narine S, PA  ondansetron (ZOFRAN-ODT) 4 MG disintegrating tablet Take 1 tablet (4 mg total) by mouth every 8 (eight) hours as needed for nausea or vomiting. 06/02/22  Yes Daissy Yerian, Ova Freshwater S, PA  Ascorbic Acid (VITAMIN C) 1000 MG tablet Take 1,000 mg by mouth daily.    [provider]  b complex vitamins tablet Take 1 tablet by mouth daily.    [provider]  Calcium Carbonate-Vitamin D (CALCIUM 600 + D PO) Take 1 tablet by mouth 2 (two) times daily. Patient not taking: Reported on 10/27/2021    [provider]  Glutamine POWD Take by mouth. In coffee every morning    [provider]  LORazepam (ATIVAN) 1 MG tablet Take 1 mg by mouth.    [provider]      Allergies    Penicillins    Review of Systems   Review of  Systems  Gastrointestinal:  Positive for abdominal pain.    Physical Exam Updated Vital Signs BP 125/83   Pulse 65   Temp 97.7 F (36.5 C) (Oral)   Resp 18   Ht 6\' 2"  (1.88 m)   Wt 99.8 kg   SpO2 95%   BMI 28.25 kg/m  Physical Exam Vitals and nursing note reviewed.  Constitutional:      General: He is not in acute distress. HENT:     Head: Normocephalic and atraumatic.     Nose: Nose normal.  Eyes:     General: No scleral icterus. Cardiovascular:     Rate and Rhythm: Normal rate and regular rhythm.     Pulses: Normal pulses.     Heart sounds: Normal heart sounds.  Pulmonary:     Effort: Pulmonary effort is normal. No respiratory distress.     Breath sounds: No wheezing.  Abdominal:     Palpations: Abdomen is soft.     Tenderness: There is no abdominal tenderness.  Musculoskeletal:     Cervical back: Normal range of motion.     Right lower leg: No edema.     Left lower leg: No edema.  Skin:    General: Skin is warm and dry.     Capillary Refill: Capillary refill takes less than 2 seconds.  Neurological:  Mental Status: He is alert. Mental status is at baseline.  Psychiatric:        Mood and Affect: Mood normal.        Behavior: Behavior normal.     ED Results / Procedures / Treatments   Labs (all labs ordered are listed, but only abnormal results are displayed) Labs Reviewed  CBC WITH DIFFERENTIAL/PLATELET  COMPREHENSIVE METABOLIC PANEL  LIPASE, BLOOD  URINALYSIS, ROUTINE W REFLEX MICROSCOPIC    EKG None  Radiology CT ABDOMEN PELVIS W CONTRAST  Result Date: 06/01/2022 CLINICAL DATA:  Lower abdominal pain and loose stools, recurrent times 2-3 months. Previous history of prostate cancer. Sent to the hospital from [facility] to rule out small bowel obstruction. EXAM: CT ABDOMEN AND PELVIS WITH CONTRAST TECHNIQUE: Multidetector CT imaging of the abdomen and pelvis was performed using the standard protocol following bolus administration of intravenous contrast.  RADIATION DOSE REDUCTION: This exam was performed according to the departmental dose-optimization program which includes automated exposure control, adjustment of the mA and/or kV according to patient size and/or use of iterative reconstruction technique. CONTRAST:  179mL OMNIPAQUE IOHEXOL 300 MG/ML  SOLN COMPARISON:  CT with IV contrast 07/11/2017 FINDINGS: Lower chest: Linear and reticular atelectasis both posterior lung bases. Lung bases are otherwise clear. The cardiac size is normal. There is calcification in the right coronary artery. No pericardial effusion. Hepatobiliary: Again noted inferiorly in hepatic segment 5, there is a stable 2.2 cm liver hemangioma. There is no hepatic mass enhancement. The gallbladder and bile ducts are unremarkable. Pancreas: No abnormality. Spleen: No abnormality.  No splenomegaly. Adrenals/Urinary Tract: There is no adrenal mass. In the lower outer right kidney there is a 1 cm cyst, internal Hounsfield density is 9.2. This was previously 5 mm. Anteriorly in the left kidney there is a 2.7 cm cyst with internal Hounsfield density of 8, was previously 2.2 cm. No follow-up imaging is recommended for either of these. There is no mass enhancement either side. There is symmetric renal excretion on the delayed phase. There is no urinary stone or obstruction. There is no bladder thickening. Stomach/Bowel: There are mild thickened folds in the stomach and left-sided abdominal small bowel consistent with gastroenteritis. No small bowel obstruction or inflammation is seen. An appendix is not seen in this patient. There is a high-riding cecum. Mild constipation. There are colonic diverticula without evidence of focal colitis or diverticulitis. Vascular/Lymphatic: Aortic atherosclerosis. No enlarged abdominal or pelvic lymph nodes. Reproductive: No prostatomegaly. Both testicles are in the scrotal sac. Other: There is no free air, free hemorrhage, free fluid or incarcerated hernia. There is a  small umbilical fat hernia. There are small inguinal fat hernias. Musculoskeletal: There is chronic disc collapse and endplate irregularities at L2-3 and L5-S1. Mild hip DJD. No destructive regional bone lesions. IMPRESSION: 1. Gastroenteritis without evidence of small-bowel obstruction or inflammation. 2. Constipation and diverticulosis. 3. Aortic and coronary artery atherosclerosis. 4. Stable hepatic hemangioma. 5. Umbilical and inguinal fat hernias. 6. Chronic disc collapse and endplate irregularities at L2-3 and L5-S1. Aortic Atherosclerosis (ICD10-I70.0). Electronically Signed   By: Telford Nab M.D.   On: 06/01/2022 23:52    Procedures Procedures    Medications Ordered in ED Medications  dicyclomine (BENTYL) capsule 20 mg (20 mg Oral Given 06/01/22 2255)  metoCLOPramide (REGLAN) injection 10 mg (10 mg Intravenous Given 06/01/22 2303)  diphenhydrAMINE (BENADRYL) capsule 25 mg (25 mg Oral Given 06/01/22 2303)  iohexol (OMNIPAQUE) 300 MG/ML solution 100 mL (100 mLs Intravenous Contrast Given 06/01/22 2309)  ED Course/ Medical Decision Making/ A&P                             Medical Decision Making Risk Prescription drug management.   This patient presents to the ED for concern of abd pain, this involves a number of treatment options, and is a complaint that carries with it a moderate to high risk of complications and morbidity. A differential diagnosis was considered for the patient's symptoms which is discussed below:   The causes of generalized abdominal pain include but are not limited to AAA, mesenteric ischemia, appendicitis, diverticulitis, DKA, gastritis, gastroenteritis, AMI, nephrolithiasis, pancreatitis, peritonitis, adrenal insufficiency,lead poisoning, iron toxicity, intestinal ischemia, constipation, UTI,SBO/LBO, splenic rupture, biliary disease, IBD, IBS, PUD, or hepatitis.   Co morbidities: Discussed in HPI   Brief History:  Patient is a 62 year old male present  emergency room today with complaints of 2 to 3 months of intermittent abdominal pain.  He states that he has had some loose stool today but decreased BMs in general.  Denies any urinary frequency urgency dysuria hematuria.  No fevers chills nausea or vomiting.  He was seen at his [place] PCP and referred to an urgent care who referred him to the emergency room because they are confirmed for a small bowel obstruction. He states that he currently feels generally well states that this pain is 1 or 2 out of 10.  Denies any nausea or vomiting.  No other associate symptoms currently.  No testicular pain no abdominal trauma.    EMR reviewed including pt PMHx, past surgical history and past visits to ER.   See HPI for more details   Lab Tests:   I personally reviewed all laboratory work and imaging. Metabolic panel without any acute abnormality specifically kidney function within normal limits and no significant electrolyte abnormalities. CBC without leukocytosis or significant anemia.   Imaging Studies:  NAD. I personally reviewed all imaging studies and no acute abnormality found. I agree with radiology interpretation.  IMPRESSION:  1. Gastroenteritis without evidence of small-bowel obstruction or  inflammation.  2. Constipation and diverticulosis.  3. Aortic and coronary artery atherosclerosis.  4. Stable hepatic hemangioma.  5. Umbilical and inguinal fat hernias.  6. Chronic disc collapse and endplate irregularities at L2-3 and  L5-S1.    Aortic Atherosclerosis (ICD10-I70.0).    Cardiac Monitoring:  The patient was maintained on a cardiac monitor.  I personally viewed and interpreted the cardiac monitored which showed an underlying rhythm of: NSR NA   Medicines ordered:  I ordered medication including benadryl/reglan, bentyl  for abd pain Reevaluation of the patient after these medicines showed that the patient improved I have reviewed the patients home medicines and have made  adjustments as needed   Critical Interventions:     Consults/Attending Physician      Reevaluation:  After the interventions noted above I re-evaluated patient and found that they have :improved   Social Determinants of Health:      Problem List / ED Course:  Reassuring work up here. GI follow up recommended. CT ordered in triage is unremarkable apart from some ?gastroenteritis. Bentyl/zofran and GI FU. Return precautions provided. Pt will otherwise FU outpatient.    Dispostion:  After consideration of the diagnostic results and the patients response to treatment, I feel that the patent would benefit from Close OP follow up.     Final Clinical Impression(s) / ED Diagnoses Final diagnoses:  Generalized abdominal pain  Rx / DC Orders ED Discharge Orders          Ordered    dicyclomine (BENTYL) 20 MG tablet  2 times daily        06/02/22 0006    ondansetron (ZOFRAN-ODT) 4 MG disintegrating tablet  Every 8 hours PRN        06/02/22 0006              Gailen Shelter, [place] 06/02/22 0152    Nira Conn, MD 06/06/22 1739

## 2022-06-02 MED ORDER — ONDANSETRON 4 MG PO TBDP: 4.0000 mg | ORAL_TABLET | Freq: Three times a day (TID) | ORAL | 0 refills | Status: AC | PRN

## 2022-06-02 MED ORDER — DICYCLOMINE HCL 20 MG PO TABS: 20.0000 mg | ORAL_TABLET | Freq: Two times a day (BID) | ORAL | 0 refills | Status: AC

## 2022-06-02 NOTE — Discharge Instructions (Addendum)
Your workup today was reassuring.  Your CT did not show any acute findings. I have printed a copy for your records.  Hydrate, follow up with your primary care provider and GI.

## 2022-06-02 NOTE — ED Notes (Signed)
Pt A&OX4 ambulatory at d/c with independent steady gait. Pt verbalized understanding of d/c instructions, prescriptions and follow up care. 

## 2022-09-09 ENCOUNTER — Encounter: Payer: No Typology Code available for payment source | Attending: Family Medicine | Admitting: Registered"

## 2022-09-09 ENCOUNTER — Encounter: Payer: Self-pay | Admitting: Registered"

## 2022-09-09 DIAGNOSIS — Z713 Dietary counseling and surveillance: Secondary | ICD-10-CM | POA: Insufficient documentation

## 2022-09-09 NOTE — Progress Notes (Signed)
Medical Nutrition Therapy  Appointment Start time:  [phone]  Appointment End time:  1510  Primary concerns today: gas, bloating and constipation  Referral diagnosis: bloating Preferred learning style: no preference indicated Learning readiness: contemplating, ready  Pt declined follow-up appt, instead he will wait to see what insurance will cover and call the office if he desires a follow-up visit.  NUTRITION ASSESSMENT  Anthropometrics  Not assessed   Clinical Medical Hx: OSA on CPAP, Traumatic Brain Injury, pt reported s/p radiation for prostate cancer 3-4 yrs ago Medications: reviewed Labs: reviewed Notable Signs/Symptoms: bloating, gas, constipation. Pt reports he believes the pressure from trapped gas is also contribution to back pain, shoulder pain, headaches.  Lifestyle & Dietary Hx Pt states 3-4 yrs ago was treaded for prostate cancer and radiation affected his gastric motility and started having issues with trapped gas. Pt states his mother avoids gluten so he has been trying to eat gluten-free and also tries to get organic options.  Meal structure: Pt states he "should" eat 3 meals per day, but busy doing stuff in afternoons and skips lunch 3-4x/week.  Pt states he uses a CPAP and thinks he might be swallowing air.   Constipation: Pt reports ~3 BM per week. States he found an [place] meal that he heats up in the microwave that seems to help him be more regular. (Lentil Masala?)  Estimated daily fluid intake: 80-100 oz Supplements:  Sleep: not good Stress / self-care: not assessed Current average weekly physical activity: not assessed  24-Hr Dietary Recall First Meal: oatmeal, raw cashews, organic raisins, coffee, [employer] protein bar, water Snack: none Second Meal: GF bread sandwich, spicy mustard, [place]  Snack: none (sometimes has protein wild berry shake at [employer]) Third Meal:  Snack: [place] burger, onions, avocado, rice Beverages: water, coffee with  L-glutamine powder, almond milk, gingerale, selzer and club soda  Estimated Energy Needs Calories: not assessed  NUTRITION DIAGNOSIS  NB-1.1 Food and nutrition-related knowledge deficit As related to foods that cause gas.  As evidenced by only focused on eating gluten-free and organic foods in attempt to reduce symptoms.  NUTRITION INTERVENTION  Nutrition education (E-1) on the following topics:  Multi-factoral causes for stomach issues Medication side effects Foods that can cause gas Purpose of short-term elimination diet  Handouts Provided Include  none  Learning Style & Readiness for Change Teaching method utilized: Visual & Auditory  Demonstrated degree of understanding via: Teach Back  Barriers to learning/adherence to lifestyle change: difficulty with ADLs?  Goals Established by Pt To help reduce gas please consider the following recommendations. Try the following FOR 1 WEEK to see if these changes make a difference. 3 days before your next visit keep a food journal of everything you eat and bring to the dietitian appt to see if they can pinpoint what might be contributing to your symptoms  Aerophagia, or swallowing air, is one potential side effect of continuous positive airway pressure (CPAP) therapy. ([email or web address])  No carbonated water or carbonated drinks  Okay: Drink water, coffee, almond milk, regular milk is fine as well  If you want electrolyte drink mix look for ones without artificial sweetener,  No sugar alcohol (manitol, erytitol, sorbitol, etc) or sucralose. Stevia should be okay.  Make your own protein bars and shakes using simple ingredients. Look for recipes and substitute the following: Protein source: plain greek yogurt or unflavored whey protein powder (no other flavors added), nut butters Sweetness: Maple syrup, cane sugar, dark choc chip (no artificial sweeteners)  Flour: coconut or almond flour  Bread: Sourdough (look at ingredients, should NOT  contain yeast) Reino Bellis is one brand that would work  Fruit: just have small amounts. Bananas should be okay. Vegetables: avoid asparagus, broccoli, Brussels sprouts, cabbage, cucumbers  MONITORING & EVALUATION Dietary intake, weekly physical activity, and gas/bloating symptoms in prn.

## 2022-09-09 NOTE — Patient Instructions (Addendum)
To help reduce gas please consider the following recommendations. Try the following FOR 1 WEEK to see if these changes make a difference. 3 days before your next visit keep a food journal of everything you eat and bring to the dietitian appt to see if they can pinpoint what might be contributing to your symptoms  Aerophagia, or swallowing air, is one potential side effect of continuous positive airway pressure (CPAP) therapy. ([email or web address])  No carbonated water or carbonated drinks  Okay: Drink water, coffee, almond milk, regular milk is fine as well  If you want electrolyte drink mix look for ones without artificial sweetener,  No sugar alcohol (manitol, erytitol, sorbitol, etc) or sucralose. Stevia should be okay.  Make your own protein bars and shakes using simple ingredients. Look for recipes and substitute the following: Protein source: plain greek yogurt or unflavored whey protein powder (no other flavors added), nut butters Sweetness: Maple syrup, cane sugar, dark choc chip (no artificial sweeteners) Flour: coconut or almond flour  Bread: Sourdough (look at ingredients, should NOT contain yeast) Reino Bellis is one brand that would work  Fruit: just have small amounts. Bananas should be okay. Vegetables: avoid asparagus, broccoli, Brussels sprouts, cabbage, cucumbers
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
## 2023-10-25 ENCOUNTER — Encounter: Payer: Self-pay | Admitting: Physician Assistant

## 2023-11-05 ENCOUNTER — Ambulatory Visit: Admitting: Physician Assistant
